# Patient Record
Sex: Female | Born: 2000 | Race: Black or African American | Hispanic: No | Marital: Single | Smoking: Never smoker
Health system: Southern US, Community
[De-identification: ages and names within clinical notes are randomized; demographics above are authoritative.]

## PROBLEM LIST (undated history)

## (undated) DIAGNOSIS — K8020 Calculus of gallbladder without cholecystitis without obstruction: Secondary | ICD-10-CM

---
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
## 2022-01-25 DIAGNOSIS — Z419 Encounter for procedure for purposes other than remedying health state, unspecified: Secondary | ICD-10-CM | POA: Diagnosis not present

## 2022-02-25 DIAGNOSIS — Z419 Encounter for procedure for purposes other than remedying health state, unspecified: Secondary | ICD-10-CM | POA: Diagnosis not present

## 2022-03-27 DIAGNOSIS — Z419 Encounter for procedure for purposes other than remedying health state, unspecified: Secondary | ICD-10-CM | POA: Diagnosis not present

## 2022-04-27 DIAGNOSIS — Z419 Encounter for procedure for purposes other than remedying health state, unspecified: Secondary | ICD-10-CM | POA: Diagnosis not present

## 2022-05-27 DIAGNOSIS — Z419 Encounter for procedure for purposes other than remedying health state, unspecified: Secondary | ICD-10-CM | POA: Diagnosis not present

## 2022-06-27 DIAGNOSIS — Z419 Encounter for procedure for purposes other than remedying health state, unspecified: Secondary | ICD-10-CM | POA: Diagnosis not present

## 2022-07-28 DIAGNOSIS — Z419 Encounter for procedure for purposes other than remedying health state, unspecified: Secondary | ICD-10-CM | POA: Diagnosis not present

## 2022-08-09 ENCOUNTER — Other Ambulatory Visit (HOSPITAL_COMMUNITY)
Admission: RE | Admit: 2022-08-09 | Discharge: 2022-08-09 | Disposition: A | Discharge status: Home / Self Care | Payer: Medicaid Other | Source: Ambulatory Visit | Attending: Family Medicine | Admitting: Family Medicine

## 2022-08-09 ENCOUNTER — Ambulatory Visit (INDEPENDENT_AMBULATORY_CARE_PROVIDER_SITE_OTHER): Payer: Medicaid Other

## 2022-08-09 ENCOUNTER — Other Ambulatory Visit: Payer: Self-pay

## 2022-08-09 DIAGNOSIS — Z3480 Encounter for supervision of other normal pregnancy, unspecified trimester: Secondary | ICD-10-CM

## 2022-08-09 DIAGNOSIS — O0990 Supervision of high risk pregnancy, unspecified, unspecified trimester: Secondary | ICD-10-CM | POA: Insufficient documentation

## 2022-08-09 MED ORDER — PRENATAL PLUS 27-1 MG PO TABS: 1.0000 | ORAL_TABLET | Freq: Every day | ORAL | 11 refills | Status: DC

## 2022-08-09 MED ORDER — BLOOD PRESSURE MONITORING DEVI: 1.0000 | 0 refills | Status: DC

## 2022-08-09 NOTE — Progress Notes (Signed)
New OB Intake  I connected with  Audrey Sanders on 08/09/22 at  1:15 PM EDT by In Person Visit and verified that I am speaking with the correct person using two identifiers. Nurse is located at [facility] and pt is located at [facility]  I discussed the limitations, risks, security and privacy concerns of performing an evaluation and management service by telephone and the availability of in person appointments. I also discussed with the patient that there may be a patient responsible charge related to this service. The patient expressed understanding and agreed to proceed.  I explained I am completing New OB Intake today. We discussed her EDD of 02/19/23 that is based on LMP of 05/15/22. Pt is G1/P0. I reviewed her allergies, medications, Medical/Surgical/OB history, and appropriate screenings. I informed her of [facility] services. [facility] information placed in AVS. Based on history, this is a/an  pregnancy uncomplicated .   There are no problems to display for this patient.   Concerns addressed today  Delivery Plans Plans to deliver at [facility]. Patient given information for [facility] Healthy [facility] website for more information about [facility]. Patient is not interested in water birth. Offered upcoming OB visit with CNM to discuss further.  MyChart/Babyscripts MyChart access verified. I explained pt will have some visits in office and some virtually. Babyscripts instructions given and order placed. Patient verifies receipt of registration text/e-mail. Account successfully created and app downloaded.  Blood Pressure Cuff/Weight Scale Blood pressure cuff ordered for patient to pick-up from [employer]. Explained after first prenatal appt pt will check weekly and document in Babyscripts. Patient does / does not  have weight scale. Weight scale ordered for patient to pick up from [employer].   Anatomy US Explained first scheduled [place] will be around 19 weeks. Anatomy US scheduled for  1030/23 at 0945a. Pt notified to arrive at 0930.  Labs Discussed Avelina Laine genetic screening with patient. Would like both Panorama and Horizon drawn at new OB visit. Routine prenatal labs needed.  Covid Vaccine Patient has covid vaccine.   Is patient a CenteringPregnancy candidate?  Declined Declined due to [facility] Not a candidate due to NA Centering Patient" indicated on sticky note   Is patient a Mom+Baby Combined Care candidate?  Accepted    Scheduled with Mom+Baby provider   Social Determinants of Health Food Insecurity: Patient denies food insecurity. WIC Referral: Patient is interested in referral to [facility].  Transportation: Patient denies transportation needs. Childcare: Discussed no children allowed at ultrasound appointments. Offered childcare services; patient declines childcare services at this time.  First visit review I reviewed new OB appt with pt. I explained she will have a provider visit that includes . Explained pt will be seen by Dr. Crissie Reese at first visit; encounter routed to appropriate provider. Explained that patient will be seen by pregnancy navigator following visit with provider.   Henrietta Dine, CMA 08/09/2022  1:30 PM

## 2022-08-09 NOTE — Patient Instructions (Signed)

## 2022-08-10 ENCOUNTER — Encounter: Payer: Self-pay | Admitting: Family Medicine

## 2022-08-10 DIAGNOSIS — Z2839 Other underimmunization status: Secondary | ICD-10-CM | POA: Insufficient documentation

## 2022-08-10 DIAGNOSIS — O09899 Supervision of other high risk pregnancies, unspecified trimester: Secondary | ICD-10-CM | POA: Insufficient documentation

## 2022-08-10 LAB — CBC/D/PLT+RPR+RH+ABO+RUBIGG...
Antibody Screen: NEGATIVE
Basophils Absolute: 0 10*3/uL (ref 0.0–0.2)
Basos: 0 %
EOS (ABSOLUTE): 0.1 10*3/uL (ref 0.0–0.4)
Eos: 1 %
HCV Ab: NONREACTIVE
HIV Screen 4th Generation wRfx: NONREACTIVE
Hematocrit: 34.4 % (ref 34.0–46.6)
Hemoglobin: 12.2 g/dL (ref 11.1–15.9)
Hepatitis B Surface Ag: NEGATIVE
Immature Grans (Abs): 0 10*3/uL (ref 0.0–0.1)
Immature Granulocytes: 0 %
Lymphocytes Absolute: 1.7 10*3/uL (ref 0.7–3.1)
Lymphs: 26 %
MCH: 31.3 pg (ref 26.6–33.0)
MCHC: 35.5 g/dL (ref 31.5–35.7)
MCV: 88 fL (ref 79–97)
Monocytes Absolute: 0.5 10*3/uL (ref 0.1–0.9)
Monocytes: 7 %
Neutrophils Absolute: 4.3 10*3/uL (ref 1.4–7.0)
Neutrophils: 66 %
Platelets: 234 10*3/uL (ref 150–450)
RBC: 3.9 x10E6/uL (ref 3.77–5.28)
RDW: 11.8 % (ref 11.7–15.4)
RPR Ser Ql: NONREACTIVE
Rh Factor: POSITIVE
Rubella Antibodies, IGG: <0.9 index — ABNORMAL LOW (ref >0.99)
WBC: 6.6 10*3/uL (ref 3.4–10.8)

## 2022-08-10 LAB — HCV INTERPRETATION

## 2022-08-10 LAB — GC/CHLAMYDIA PROBE AMP (~~LOC~~) NOT AT ARMC
Chlamydia: NEGATIVE
Comment: NEGATIVE
Comment: NORMAL
Neisseria Gonorrhea: NEGATIVE

## 2022-08-10 LAB — HEMOGLOBIN A1C
Est. average glucose Bld gHb Est-mCnc: 114 mg/dL
Hgb A1c MFr Bld: 5.6 % (ref 4.8–5.6)

## 2022-08-11 ENCOUNTER — Encounter: Payer: Self-pay | Admitting: Family Medicine

## 2022-08-11 ENCOUNTER — Other Ambulatory Visit: Payer: Self-pay

## 2022-08-11 ENCOUNTER — Ambulatory Visit (INDEPENDENT_AMBULATORY_CARE_PROVIDER_SITE_OTHER): Payer: Medicaid Other | Admitting: Family Medicine

## 2022-08-11 VITALS — BP 119/78 | HR 84 | Wt 99.8 lb

## 2022-08-11 DIAGNOSIS — Z2839 Other underimmunization status: Secondary | ICD-10-CM

## 2022-08-11 DIAGNOSIS — O09899 Supervision of other high risk pregnancies, unspecified trimester: Secondary | ICD-10-CM

## 2022-08-11 DIAGNOSIS — Z3480 Encounter for supervision of other normal pregnancy, unspecified trimester: Secondary | ICD-10-CM

## 2022-08-11 LAB — CULTURE, OB URINE

## 2022-08-11 LAB — URINE CULTURE, OB REFLEX

## 2022-08-11 NOTE — Patient Instructions (Signed)

## 2022-08-11 NOTE — Progress Notes (Signed)
      Subjective:   Audrey Sanders is a 21 y.o. G1P0000 at [redacted]w[redacted]d by LMP being seen today for her first obstetrical visit.  Her obstetrical history is significant for  teen pregnancy . Patient does intend to breast feed. Pregnancy history fully reviewed.  Patient reports no complaints.  HISTORY: OB History  Gravida Para Term Preterm AB Living  1 0 0 0 0 0  SAB IAB Ectopic Multiple Live Births  0 0 0 0 0    # Outcome Date GA Lbr Len/2nd Weight Sex Delivery Anes PTL Lv  1 Current              Last pap smear: No results found for: "DIAGPAP", "HPV", "Satilla" N/a  History reviewed. No pertinent past medical history. History reviewed. No pertinent surgical history. History reviewed. No pertinent family history. Social History   Tobacco Use   Smoking status: Never    Passive exposure: Never   Smokeless tobacco: Never  Vaping Use   Vaping Use: Never used  Substance Use Topics   Alcohol use: Never   Drug use: Never   Not on File Current Outpatient Medications on File Prior to Visit  Medication Sig Dispense Refill   Blood Pressure Monitoring DEVI 1 each by Does not apply route once a week. 1 each 0   prenatal vitamin w/FE, FA (PRENATAL 1 + 1) 27-1 MG TABS tablet Take 1 tablet by mouth daily at 12 noon. 30 tablet 11   No current facility-administered medications on file prior to visit.     Exam   Vitals:   08/11/22 0916  BP: 119/78  Pulse: 84  Weight: 99 lb 12.8 oz (45.3 kg)   Fetal Heart Rate (bpm): 169  System: General: well-developed, well-nourished female in no acute distress   Skin: normal coloration and turgor, no rashes   Neurologic: oriented, normal, negative, normal mood   Extremities: normal strength, tone, and muscle mass, ROM of all joints is normal   HEENT PERRLA, extraocular movement intact and sclera clear, anicteric   Neck supple and no masses   Respiratory:  no respiratory distress      Assessment:   Pregnancy: G1P0000 Patient Active  Problem List   Diagnosis Date Noted   Rubella non-immune status, antepartum 08/10/2022   Supervision of other normal pregnancy, antepartum 08/09/2022     Plan:  1. Supervision of other normal pregnancy, antepartum Initial labs reviewed, notable only for rubella non immune Continue prenatal vitamins. Genetic Screening discussed, NIPS: result pending. Ultrasound discussed; fetal anatomic survey: ordered. Problem list reviewed and updated. The nature of Dyad/Family Care clinic was explained to patient; Voiced they may need to be seen by other [facility] providers which includes family medicine physicians, OB GYNs, and APPs. Delivery will hopefully be with one of the Dyad providers or another [facility] Medicine physician and we cannot promise this at this time.  Discussed there are [facility] staff in the hospital 24-7 and they understand and support this model and there is a likelihood one of these providers will catch their baby.  We also discussed that the service includes learners (residents, student) and they will be involved in the care team.   2. Rubella non-immune status, antepartum Offer MMR PP   Routine obstetric precautions reviewed. Return in 4 weeks (on 09/08/2022) for Dyad patient, ob visit.

## 2022-08-15 LAB — PANORAMA PRENATAL TEST FULL PANEL:PANORAMA TEST PLUS 5 ADDITIONAL MICRODELETIONS: FETAL FRACTION: 8.1

## 2022-08-21 LAB — HORIZON CUSTOM: REPORT SUMMARY: NEGATIVE

## 2022-08-24 ENCOUNTER — Encounter: Payer: Self-pay | Admitting: Family Medicine

## 2022-08-27 DIAGNOSIS — Z419 Encounter for procedure for purposes other than remedying health state, unspecified: Secondary | ICD-10-CM | POA: Diagnosis not present

## 2022-09-12 ENCOUNTER — Encounter: Payer: Self-pay | Admitting: *Deleted

## 2022-09-12 DIAGNOSIS — Z3480 Encounter for supervision of other normal pregnancy, unspecified trimester: Secondary | ICD-10-CM

## 2022-09-14 ENCOUNTER — Other Ambulatory Visit: Payer: Self-pay

## 2022-09-14 ENCOUNTER — Ambulatory Visit (INDEPENDENT_AMBULATORY_CARE_PROVIDER_SITE_OTHER): Payer: Medicaid Other | Admitting: Family Medicine

## 2022-09-14 VITALS — BP 113/73 | HR 96 | Wt 102.6 lb

## 2022-09-14 DIAGNOSIS — O09899 Supervision of other high risk pregnancies, unspecified trimester: Secondary | ICD-10-CM

## 2022-09-14 DIAGNOSIS — Z2839 Other underimmunization status: Secondary | ICD-10-CM

## 2022-09-14 DIAGNOSIS — Z3480 Encounter for supervision of other normal pregnancy, unspecified trimester: Secondary | ICD-10-CM | POA: Diagnosis not present

## 2022-09-14 NOTE — Progress Notes (Signed)
   PRENATAL VISIT NOTE  Subjective:  Audrey Sanders is a 21 y.o. G1P0000 at [redacted]w[redacted]d being seen today for ongoing prenatal care.  She is currently monitored for the following issues for this low-risk pregnancy and has Supervision of other normal pregnancy, antepartum and Rubella non-immune status, antepartum on their problem list.  Patient reports no complaints.  Contractions: Not present. Vag. Bleeding: None.  Movement: Present. Denies leaking of fluid.   The following portions of the patient's history were reviewed and updated as appropriate: allergies, current medications, past family history, past medical history, past social history, past surgical history and problem list.   Objective:   Vitals:   09/14/22 1111  BP: 113/73  Pulse: 96  Weight: 102 lb 9.6 oz (46.5 kg)    Fetal Status: Fetal Heart Rate (bpm): 156 Fundal Height: 17 cm Movement: Present     General:  Alert, oriented and cooperative. Patient is in no acute distress.  Skin: Skin is warm and dry. No rash noted.   Cardiovascular: Normal heart rate noted  Respiratory: Normal respiratory effort, no problems with respiration noted  Abdomen: Soft, gravid, appropriate for gestational age.  Pain/Pressure: Absent     Pelvic: Cervical exam deferred        Extremities: Normal range of motion.     Mental Status: Normal mood and affect. Normal behavior. Normal judgment and thought content.   Assessment and Plan:  Pregnancy: G1P0000 at [redacted]w[redacted]d 1. Supervision of other normal pregnancy, antepartum TWG=2 lb 9.6 oz (1.179 kg)  - AFP, Serum, Open Spina Bifida  2. Rubella non-immune status, antepartum MMR PP  Preterm labor symptoms and general obstetric precautions including but not limited to vaginal bleeding, contractions, leaking of fluid and fetal movement were reviewed in detail with the patient. Please refer to After Visit Summary for other counseling recommendations.   Return in about 4 weeks (around 10/12/2022) for Mom+Baby  Combined Care.  Future Appointments  Date Time Provider [place]  09/25/2022  9:45 AM [facility] US5 [facility]  10/13/2022 11:15 AM Clarnce Flock, MD [facility]  11/13/2022 10:15 AM Caren Macadam, MD [facility]    Caren Macadam, MD

## 2022-09-16 LAB — AFP, SERUM, OPEN SPINA BIFIDA
AFP MoM: 0.77
AFP Value: 44.6 ng/mL
Gest. Age on Collection Date: 17.3 weeks
Maternal Age At EDD: 19.2 yr
OSBR Risk 1 IN: 10000
Test Results:: NEGATIVE
Weight: 102 [lb_av]

## 2022-09-25 ENCOUNTER — Ambulatory Visit: Payer: Medicaid Other | Attending: Family Medicine

## 2022-09-27 DIAGNOSIS — Z419 Encounter for procedure for purposes other than remedying health state, unspecified: Secondary | ICD-10-CM | POA: Diagnosis not present

## 2022-10-13 ENCOUNTER — Encounter: Payer: Self-pay | Admitting: Family Medicine

## 2022-10-13 ENCOUNTER — Ambulatory Visit (INDEPENDENT_AMBULATORY_CARE_PROVIDER_SITE_OTHER): Payer: Medicaid Other | Admitting: Family Medicine

## 2022-10-13 VITALS — BP 112/70 | HR 87 | Ht 61.0 in | Wt 106.3 lb

## 2022-10-13 DIAGNOSIS — O09899 Supervision of other high risk pregnancies, unspecified trimester: Secondary | ICD-10-CM

## 2022-10-13 DIAGNOSIS — Z2839 Other underimmunization status: Secondary | ICD-10-CM

## 2022-10-13 DIAGNOSIS — Z3480 Encounter for supervision of other normal pregnancy, unspecified trimester: Secondary | ICD-10-CM

## 2022-10-13 NOTE — Progress Notes (Signed)
   Subjective:  Audrey Sanders is a 21 y.o. G1P0000 at 75w4dbeing seen today for ongoing prenatal care.  She is currently monitored for the following issues for this low-risk pregnancy and has Supervision of other normal pregnancy, antepartum and Rubella non-immune status, antepartum on their problem list.  Patient reports no complaints.  Contractions: Not present. Vag. Bleeding: None.  Movement: Present. Denies leaking of fluid.   The following portions of the patient's history were reviewed and updated as appropriate: allergies, current medications, past family history, past medical history, past social history, past surgical history and problem list. Problem list updated.  Objective:   Vitals:   10/13/22 1121 10/13/22 1123  BP: 112/70   Pulse: 87   Weight: 106 lb 4.8 oz (48.2 kg)   Height:  _0  (1.549 m)    Fetal Status: Fetal Heart Rate (bpm): 153   Movement: Present     General:  Alert, oriented and cooperative. Patient is in no acute distress.  Skin: Skin is warm and dry. No rash noted.   Cardiovascular: Normal heart rate noted  Respiratory: Normal respiratory effort, no problems with respiration noted  Abdomen: Soft, gravid, appropriate for gestational age. Pain/Pressure: Absent     Pelvic: Vag. Bleeding: None     Cervical exam deferred        Extremities: Normal range of motion.     Mental Status: Normal mood and affect. Normal behavior. Normal judgment and thought content.   Urinalysis:      Assessment and Plan:  Pregnancy: G1P0000 at 246w4d1. Supervision of other normal pregnancy, antepartum BP and FHR normal Missed appt for anatomy scan, she will go reschedule w MFM after our visit  2. Rubella non-immune status, antepartum Offer MMR PP  Preterm labor symptoms and general obstetric precautions including but not limited to vaginal bleeding, contractions, leaking of fluid and fetal movement were reviewed in detail with the patient. Please refer to After Visit  Summary for other counseling recommendations.  Return in 4 weeks (on 11/10/2022) for Dyad patient, ob visit.   EcClarnce FlockMD

## 2022-10-13 NOTE — Patient Instructions (Addendum)

## 2022-10-24 ENCOUNTER — Ambulatory Visit: Payer: Medicaid Other | Attending: Family Medicine

## 2022-10-24 DIAGNOSIS — Z3480 Encounter for supervision of other normal pregnancy, unspecified trimester: Secondary | ICD-10-CM | POA: Insufficient documentation

## 2022-10-24 DIAGNOSIS — Z363 Encounter for antenatal screening for malformations: Secondary | ICD-10-CM | POA: Diagnosis not present

## 2022-10-24 DIAGNOSIS — Z3482 Encounter for supervision of other normal pregnancy, second trimester: Secondary | ICD-10-CM | POA: Insufficient documentation

## 2022-10-24 DIAGNOSIS — Z3A23 23 weeks gestation of pregnancy: Secondary | ICD-10-CM | POA: Diagnosis not present

## 2022-10-27 DIAGNOSIS — Z419 Encounter for procedure for purposes other than remedying health state, unspecified: Secondary | ICD-10-CM | POA: Diagnosis not present

## 2022-11-13 ENCOUNTER — Encounter: Payer: Self-pay | Admitting: Family Medicine

## 2022-11-13 ENCOUNTER — Ambulatory Visit (INDEPENDENT_AMBULATORY_CARE_PROVIDER_SITE_OTHER): Payer: Medicaid Other | Admitting: Family Medicine

## 2022-11-13 ENCOUNTER — Other Ambulatory Visit: Payer: Self-pay

## 2022-11-13 VITALS — BP 108/56 | HR 109 | Wt 115.0 lb

## 2022-11-13 DIAGNOSIS — Z3480 Encounter for supervision of other normal pregnancy, unspecified trimester: Secondary | ICD-10-CM

## 2022-11-13 DIAGNOSIS — Z3A26 26 weeks gestation of pregnancy: Secondary | ICD-10-CM

## 2022-11-13 DIAGNOSIS — Z3482 Encounter for supervision of other normal pregnancy, second trimester: Secondary | ICD-10-CM

## 2022-11-13 DIAGNOSIS — Q210 Ventricular septal defect: Secondary | ICD-10-CM

## 2022-11-13 DIAGNOSIS — Z2839 Other underimmunization status: Secondary | ICD-10-CM

## 2022-11-13 DIAGNOSIS — O09892 Supervision of other high risk pregnancies, second trimester: Secondary | ICD-10-CM

## 2022-11-13 DIAGNOSIS — O09899 Supervision of other high risk pregnancies, unspecified trimester: Secondary | ICD-10-CM

## 2022-11-13 NOTE — Progress Notes (Signed)
   PRENATAL VISIT NOTE  Subjective:  Audrey Sanders is a 21 y.o. G1P0000 at [redacted]w[redacted]d being seen today for ongoing prenatal care.  She is currently monitored for the following issues for this low-risk pregnancy and has Supervision of other normal pregnancy, antepartum; Rubella non-immune status, antepartum; and VSD (ventricular septal defect), muscular on their problem list.  Patient reports no complaints.  Contractions: Not present. Vag. Bleeding: None.  Movement: Present. Denies leaking of fluid.   The following portions of the patient's history were reviewed and updated as appropriate: allergies, current medications, past family history, past medical history, past social history, past surgical history and problem list.   Objective:   Vitals:   11/13/22 1030  BP: (!) 108/56  Pulse: (!) 109  Weight: 115 lb (52.2 kg)    Fetal Status: Fetal Heart Rate (bpm): 144 Fundal Height: 27 cm Movement: Present     General:  Alert, oriented and cooperative. Patient is in no acute distress.  Skin: Skin is warm and dry. No rash noted.   Cardiovascular: Normal heart rate noted  Respiratory: Normal respiratory effort, no problems with respiration noted  Abdomen: Soft, gravid, appropriate for gestational age.  Pain/Pressure: Absent     Pelvic: Cervical exam deferred        Extremities: Normal range of motion.     Mental Status: Normal mood and affect. Normal behavior. Normal judgment and thought content.   Assessment and Plan:  Pregnancy: G1P0000 at [redacted]w[redacted]d 1. Supervision of other normal pregnancy, antepartum FH appropriate No concerns today Up to date otherwise Knows about lab visit for GTT and appt with Dr. Crissie Reese  Preterm labor symptoms and general obstetric precautions including but not limited to vaginal bleeding, contractions, leaking of fluid and fetal movement were reviewed in detail with the patient. Please refer to After Visit Summary for other counseling recommendations.   Return in  about 17 days (around 11/30/2022) for Mom+Baby Combined Care.  Future Appointments  Date Time Provider Department Center  11/30/2022  8:15 AM Venora Maples, MD [facility]  11/30/2022  8:20 AM [facility] LAB [facility]  12/13/2022  1:15 PM Federico Flake, MD [facility]  12/26/2022  9:55 AM Venora Maples, MD [facility]    Federico Flake, MD

## 2022-11-27 DIAGNOSIS — Z419 Encounter for procedure for purposes other than remedying health state, unspecified: Secondary | ICD-10-CM | POA: Diagnosis not present

## 2022-11-27 NOTE — L&D Delivery Note (Signed)
Delivery Note Audrey Sanders is a 22 y.o. G1P0000 at [redacted]w[redacted]d admitted for active labor.   GBS Status:  Negative/-- (02/29 [id number])  Labor course: Initial SVE: 6/100/-1. Augmentation with: AROM. She then progressed to complete.  ROM: 2h 48m with clear fluid  Birth: Delivery of a Live born adult  Birth Weight:   APGAR: ,   Newborn Delivery   Birth date/time: [birth year] 0127 Delivery type: SVD         Delivered via spontaneous vaginal delivery (Presentation: OA ). Nuchal cord present: No.  Shoulders and body delivered in usual fashion. Infant placed directly on mom's abdomen for bonding/skin-to-skin, baby dried and stimulated. Cord clamped x 2 after 1 minute and cut by grandmother.  Cord blood collected. Placenta delivered-Spontaneous  with 3 vessels . 20u Pitocin in 500cc LR given as a bolus prior to delivery of placenta.  Fundus firm with massage. Placenta inspected and appears to be intact with a 3 VC.  Sponge and instrument count were correct x2.  Intrapartum complications:  None Anesthesia:  none Lacerations:  none Suture Repair:  EBL (mL):228.00     Mom to postpartum.  Baby to Couplet care / Skin to Skin. Placenta to L&D   Plans to Bottlefeed Contraception:  unsure Circumcision: wants inpatient  Note sent to [facility]: [facility] for pp visit.  Delivery Report:   Review the Delivery Report for details.    Delivery by Dr. Arlyce Dice under my guidance and direct supervision   Signed: Christin Fudge, DNP,CNM 02/13/2023, 1:58 AM

## 2022-11-30 ENCOUNTER — Other Ambulatory Visit: Payer: Self-pay

## 2022-11-30 ENCOUNTER — Ambulatory Visit (INDEPENDENT_AMBULATORY_CARE_PROVIDER_SITE_OTHER): Payer: Medicaid Other | Admitting: Family Medicine

## 2022-11-30 ENCOUNTER — Other Ambulatory Visit: Payer: Medicaid Other

## 2022-11-30 ENCOUNTER — Encounter: Payer: Self-pay | Admitting: Family Medicine

## 2022-11-30 VITALS — BP 114/79 | HR 81 | Wt 115.7 lb

## 2022-11-30 DIAGNOSIS — O09893 Supervision of other high risk pregnancies, third trimester: Secondary | ICD-10-CM

## 2022-11-30 DIAGNOSIS — Q210 Ventricular septal defect: Secondary | ICD-10-CM

## 2022-11-30 DIAGNOSIS — O09899 Supervision of other high risk pregnancies, unspecified trimester: Secondary | ICD-10-CM

## 2022-11-30 DIAGNOSIS — Z3A28 28 weeks gestation of pregnancy: Secondary | ICD-10-CM

## 2022-11-30 DIAGNOSIS — Z23 Encounter for immunization: Secondary | ICD-10-CM | POA: Diagnosis not present

## 2022-11-30 DIAGNOSIS — Z3480 Encounter for supervision of other normal pregnancy, unspecified trimester: Secondary | ICD-10-CM

## 2022-11-30 DIAGNOSIS — Z2839 Other underimmunization status: Secondary | ICD-10-CM

## 2022-11-30 MED ORDER — ONDANSETRON 4 MG PO TBDP: 4.0000 mg | ORAL_TABLET | Freq: Four times a day (QID) | ORAL | 0 refills | Status: DC | PRN

## 2022-11-30 NOTE — Progress Notes (Signed)
   Subjective:  Audrey Sanders is a 22 y.o. G1P0000 at 36w3dbeing seen today for ongoing prenatal care.  She is currently monitored for the following issues for this low-risk pregnancy and has Supervision of other normal pregnancy, antepartum; Rubella non-immune status, antepartum; and VSD (ventricular septal defect), muscular on their problem list.  Patient reports no complaints.  Contractions: Not present. Vag. Bleeding: None.  Movement: Present. Denies leaking of fluid.   The following portions of the patient's history were reviewed and updated as appropriate: allergies, current medications, past family history, past medical history, past social history, past surgical history and problem list. Problem list updated.  Objective:   Vitals:   11/30/22 0839 11/30/22 0841  BP:  114/79  Pulse:  81  Weight: 115 lb 11.2 oz (52.5 kg) 115 lb 11.2 oz (52.5 kg)    Fetal Status: Fetal Heart Rate (bpm): 139   Movement: Present     General:  Alert, oriented and cooperative. Patient is in no acute distress.  Skin: Skin is warm and dry. No rash noted.   Cardiovascular: Normal heart rate noted  Respiratory: Normal respiratory effort, no problems with respiration noted  Abdomen: Soft, gravid, appropriate for gestational age. Pain/Pressure: Absent     Pelvic: Vag. Bleeding: None     Cervical exam deferred        Extremities: Normal range of motion.     Mental Status: Normal mood and affect. Normal behavior. Normal judgment and thought content.   Urinalysis:      Assessment and Plan:  Pregnancy: G1P0000 at 260w3d1. Supervision of other normal pregnancy, antepartum BP and FHR normal Unable to tolerate glucola, rescheduled and rx sent for zofran, instructed to take a half hour before next glucola  2. Rubella non-immune status, antepartum Offer MMR PP  3. VSD (ventricular septal defect), [place] reviewed, two small muscular VSD's which were  likely to resolve with growth Referral to Cardio OB, TTE ordered Given congenital heart disease also needs fetal echo, this order was placed as well  Preterm labor symptoms and general obstetric precautions including but not limited to vaginal bleeding, contractions, leaking of fluid and fetal movement were reviewed in detail with the patient. Please refer to After Visit Summary for other counseling recommendations.  Return in 2 weeks (on 12/14/2022) for Dyad patient, ob visit.   EcClarnce FlockMD

## 2022-11-30 NOTE — Patient Instructions (Signed)

## 2022-12-06 ENCOUNTER — Other Ambulatory Visit: Payer: Self-pay

## 2022-12-06 ENCOUNTER — Other Ambulatory Visit: Payer: Medicaid Other

## 2022-12-06 DIAGNOSIS — Z3480 Encounter for supervision of other normal pregnancy, unspecified trimester: Secondary | ICD-10-CM

## 2022-12-07 LAB — CBC
Hematocrit: 31.4 % — ABNORMAL LOW (ref 34.0–46.6)
Hemoglobin: 11 g/dL — ABNORMAL LOW (ref 11.1–15.9)
MCH: 31.9 pg (ref 26.6–33.0)
MCHC: 35 g/dL (ref 31.5–35.7)
MCV: 91 fL (ref 79–97)
Platelets: 289 10*3/uL (ref 150–450)
RBC: 3.45 x10E6/uL — ABNORMAL LOW (ref 3.77–5.28)
RDW: 11.5 % — ABNORMAL LOW (ref 11.7–15.4)
WBC: 6.2 10*3/uL (ref 3.4–10.8)

## 2022-12-07 LAB — GLUCOSE TOLERANCE, 2 HOURS W/ 1HR
Glucose, 1 hour: 212 mg/dL — ABNORMAL HIGH (ref 70–179)
Glucose, 2 hour: 168 mg/dL — ABNORMAL HIGH (ref 70–152)
Glucose, Fasting: 88 mg/dL (ref 70–91)

## 2022-12-07 LAB — RPR: RPR Ser Ql: NONREACTIVE

## 2022-12-07 LAB — HIV ANTIBODY (ROUTINE TESTING W REFLEX): HIV Screen 4th Generation wRfx: NONREACTIVE

## 2022-12-08 ENCOUNTER — Encounter: Payer: Self-pay | Admitting: Family Medicine

## 2022-12-08 DIAGNOSIS — O24419 Gestational diabetes mellitus in pregnancy, unspecified control: Secondary | ICD-10-CM | POA: Insufficient documentation

## 2022-12-12 ENCOUNTER — Encounter: Payer: Self-pay | Admitting: General Practice

## 2022-12-12 ENCOUNTER — Other Ambulatory Visit: Payer: Self-pay | Admitting: General Practice

## 2022-12-12 DIAGNOSIS — O24419 Gestational diabetes mellitus in pregnancy, unspecified control: Secondary | ICD-10-CM

## 2022-12-12 MED ORDER — ACCU-CHEK GUIDE VI STRP: ORAL_STRIP | 12 refills | Status: DC

## 2022-12-12 MED ORDER — ACCU-CHEK SOFTCLIX LANCETS MISC: 12 refills | Status: DC

## 2022-12-12 MED ORDER — ACCU-CHEK GUIDE W/DEVICE KIT: 1.0000 | PACK | Freq: Once | 0 refills | Status: AC

## 2022-12-13 ENCOUNTER — Encounter: Payer: Self-pay | Admitting: Family Medicine

## 2022-12-13 ENCOUNTER — Ambulatory Visit (INDEPENDENT_AMBULATORY_CARE_PROVIDER_SITE_OTHER): Payer: Medicaid Other | Admitting: Family Medicine

## 2022-12-13 ENCOUNTER — Other Ambulatory Visit: Payer: Self-pay

## 2022-12-13 VITALS — BP 100/60 | HR 87 | Wt 116.0 lb

## 2022-12-13 DIAGNOSIS — O0990 Supervision of high risk pregnancy, unspecified, unspecified trimester: Secondary | ICD-10-CM

## 2022-12-13 DIAGNOSIS — Z3A30 30 weeks gestation of pregnancy: Secondary | ICD-10-CM

## 2022-12-13 DIAGNOSIS — Q210 Ventricular septal defect: Secondary | ICD-10-CM

## 2022-12-13 DIAGNOSIS — O0993 Supervision of high risk pregnancy, unspecified, third trimester: Secondary | ICD-10-CM

## 2022-12-13 DIAGNOSIS — O24419 Gestational diabetes mellitus in pregnancy, unspecified control: Secondary | ICD-10-CM

## 2022-12-13 LAB — GLUCOSE, CAPILLARY: Glucose-Capillary: 70 mg/dL (ref 70–99)

## 2022-12-13 NOTE — Progress Notes (Signed)
   PRENATAL VISIT NOTE  Subjective:  Audrey Sanders is a 22 y.o. G1P0000 at [redacted]w[redacted]d being seen today for ongoing prenatal care.  She is currently monitored for the following issues for this high-risk pregnancy and has Supervision of high risk pregnancy, antepartum; Rubella non-immune status, antepartum; VSD (ventricular septal defect), muscular; and Gestational diabetes mellitus (GDM) affecting pregnancy, antepartum on their problem list.  Patient reports no complaints.  Contractions: Not present. Vag. Bleeding: None.  Movement: Present. Denies leaking of fluid.   The following portions of the patient's history were reviewed and updated as appropriate: allergies, current medications, past family history, past medical history, past social history, past surgical history and problem list.   Objective:   Vitals:   12/13/22 1347  BP: 100/60  Pulse: 87  Weight: 116 lb (52.6 kg)    Fetal Status: Fetal Heart Rate (bpm): 148   Movement: Present     General:  Alert, oriented and cooperative. Patient is in no acute distress.  Skin: Skin is warm and dry. No rash noted.   Cardiovascular: Normal heart rate noted  Respiratory: Normal respiratory effort, no problems with respiration noted  Abdomen: Soft, gravid, appropriate for gestational age.  Pain/Pressure: Present     Pelvic: Cervical exam deferred        Extremities: Normal range of motion.     Mental Status: Normal mood and affect. Normal behavior. Normal judgment and thought content.   Assessment and Plan:  Pregnancy: G1P0000 at [redacted]w[redacted]d  1. Gestational diabetes mellitus (GDM) affecting pregnancy, antepartum Fasting today 70 DM education and glucometer orientation provided by Colletta Maryland today!! Has Nutrition appt upcoming Reviewed need for growth [place] Discussed eating every 2-3 hours and eating for diabetes - [place] MFM OB FOLLOW UP; Future  2. Supervision of high risk pregnancy, antepartum Up to date TWG=16 lb (7.258 kg) which is at  goal Vigorous movement of baby Nervous about GDM but feeling better with teaching  3. VSD (ventricular septal defect), muscular Coordinated getting CVD appt scheduled   Preterm labor symptoms and general obstetric precautions including but not limited to vaginal bleeding, contractions, leaking of fluid and fetal movement were reviewed in detail with the patient. Please refer to After Visit Summary for other counseling recommendations.   Return in about 2 weeks (around 12/27/2022) for Mom+Baby Combined Care.  Future Appointments  Date Time Provider [place]  12/21/2022  1:15 PM [facility]  12/27/2022  2:55 PM Caren Macadam, MD [facility]  01/10/2023  3:30 PM [facility] US3 [facility]    Caren Macadam, MD

## 2022-12-19 ENCOUNTER — Other Ambulatory Visit: Payer: Medicaid Other

## 2022-12-21 ENCOUNTER — Encounter: Payer: Medicaid Other | Attending: Family Medicine | Admitting: Registered"

## 2022-12-21 ENCOUNTER — Ambulatory Visit (INDEPENDENT_AMBULATORY_CARE_PROVIDER_SITE_OTHER): Payer: Medicaid Other | Admitting: Registered"

## 2022-12-21 ENCOUNTER — Other Ambulatory Visit: Payer: Self-pay

## 2022-12-21 DIAGNOSIS — O24419 Gestational diabetes mellitus in pregnancy, unspecified control: Secondary | ICD-10-CM | POA: Insufficient documentation

## 2022-12-21 DIAGNOSIS — Z713 Dietary counseling and surveillance: Secondary | ICD-10-CM | POA: Insufficient documentation

## 2022-12-21 DIAGNOSIS — Z3A31 31 weeks gestation of pregnancy: Secondary | ICD-10-CM

## 2022-12-21 NOTE — Progress Notes (Signed)
Patient was seen for Gestational Diabetes self-management on 12/21/22  Start time 1324 and End time 1418  Estimated due date: 02/19/23; [redacted]w[redacted]d  Clinical: Medications: reviewed Medical History: reviewed Labs: OGTT 88-212H-168H, A1c 5.6%     Dietary and Lifestyle History: Pt states she is not taking her prenatal vitamin because they make her feel sick. Pt is likely not getting the nutrition she needs for healthy pregnancy.  Pt states does not like milk, only occasionally has almond milk. Eats a limited amount of yogurt and cheese. Pt mostly relies on processed snacks for dietary intake.  Diet: Pt reports sometimes doesn't eat more than 1 meal in a day, instead she might snack on things like chips, cookies, honey buns. Pt states she also likes a variety of vegetables. Pt states sometimes has a snack in the middle of the night, some BG readings may not be true fasting.  Physical Activity: walking 30 min 2x/week Stress: not assessed Sleep: nap from 8p-11p and then sleeps again 3 am - 12 pm  24 hr Recall:  First Meal: none Snack: Second meal: Snack: Third meal: 2 c rice, chicken, 1/2 c corn (144 mg/dL) Snack: Beverages: water, juice 3x/week, soda 2-3 x/week  NUTRITION INTERVENTION  Nutrition education (E-1) on the following topics:   Initial Follow-up  [x]  []  Definition of Gestational Diabetes [x]  []  Why dietary management is important in controlling blood glucose [x]  []  Effects each nutrient has on blood glucose levels []  []  Simple carbohydrates vs complex carbohydrates [x]  []  Fluid intake [x]  []  Creating a balanced meal plan [x]  []  Carbohydrate counting  [x]  []  When to check blood glucose levels [x]  []  Proper blood glucose monitoring techniques [x]  []  Effect of stress and stress reduction techniques  [x]  []  Exercise effect on blood glucose levels, appropriate exercise during pregnancy [x]  []  Importance of limiting caffeine and abstaining from alcohol and  smoking [x]  []  Medications used for blood sugar control during pregnancy [x]  []  Hypoglycemia and rule of 15 [x]  []  Postpartum self care  Patient instructed to monitor glucose levels: FBS: 60 - ? 95 mg/dL; 2 hour: ? 120 mg/dL  Patient instructed to increase intake foods with higher calcium. Pt states she is willing to try including more milk in diet in form of making smoothies/shakes at home.  Patient received handouts: Nutrition Diabetes and Pregnancy Carbohydrate Counting List  Patient will be seen for follow-up as needed.

## 2022-12-22 ENCOUNTER — Encounter: Payer: Self-pay | Admitting: Cardiology

## 2022-12-22 ENCOUNTER — Ambulatory Visit (INDEPENDENT_AMBULATORY_CARE_PROVIDER_SITE_OTHER): Payer: Medicaid Other | Admitting: Cardiology

## 2022-12-22 VITALS — BP 98/70 | HR 87 | Ht 60.5 in | Wt 117.9 lb

## 2022-12-22 DIAGNOSIS — Z3A31 31 weeks gestation of pregnancy: Secondary | ICD-10-CM

## 2022-12-22 DIAGNOSIS — Q210 Ventricular septal defect: Secondary | ICD-10-CM | POA: Diagnosis not present

## 2022-12-22 DIAGNOSIS — O0990 Supervision of high risk pregnancy, unspecified, unspecified trimester: Secondary | ICD-10-CM | POA: Diagnosis not present

## 2022-12-22 NOTE — Patient Instructions (Signed)
Medication Instructions:  Your physician recommends that you continue on your current medications as directed. Please refer to the Current Medication list given to you today.  *If you need a refill on your cardiac medications before your next appointment, please call your pharmacy*   Lab Work: None   Testing/Procedures: Your physician has requested that you have an echocardiogram. Echocardiography is a painless test that uses sound waves to create images of your heart. It provides your doctor with information about the size and shape of your heart and how well your heart's chambers and valves are working. This procedure takes approximately one hour. There are no restrictions for this procedure. Please do NOT wear cologne, perfume, aftershave, or lotions (deodorant is allowed). Please arrive 15 minutes prior to your appointment time.    Follow-Up: At [facility], you and your health needs are our priority.  As part of our continuing mission to provide you with exceptional heart care, we have created designated Provider Care Teams.  These Care Teams include your primary Cardiologist (physician) and Advanced Practice Providers (APPs -  Physician Assistants and Nurse Practitioners) who all work together to provide you with the care you need, when you need it.  We recommend signing up for the patient portal called "MyChart".  Sign up information is provided on this After Visit Summary.  MyChart is used to connect with patients for Virtual Visits (Telemedicine).  Patients are able to view lab/test results, encounter notes, upcoming appointments, etc.  Non-urgent messages can be sent to your provider as well.   To learn more about what you can do with MyChart, go to [email or web address].    Your next appointment:   12 week(s)  Provider:   Berniece Salines  [facility] [address] [place], [place] [address]

## 2022-12-22 NOTE — Progress Notes (Unsigned)
Cardio-Obstetrics Clinic  New Evaluation  Date:  12/23/2022   ID:  Audrey Sanders, DOB May 01, 2001, MRN [id number]  PCP:  Pcp, No   [facility] Providers Cardiologist:  None  Electrophysiologist:  None       Referring MD: Venora Maples, MD   Chief Complaint: " I was asked to see you because I have VSD"  History of Present Illness:    Audrey Sanders is a 22 y.o. female [G1P0000] who is being seen today for the evaluation of Muscular vsd at the request of Venora Maples, MD.   Medical history include VSD echo in 2016/03/16 showed Multiple muscular ventricular septal defects. S  She denies shortness of breath or chest pain. Denies any previous cardiac surgery. No major hospitalizations.   Prior CV Studies Reviewed: The following studies were reviewed today: TTE 2016/03/16  Veins  Normal systemic venous drainage. Three pulmonary veins seen returning to the  left atrium. Normal pulmonary vein velocity.   Atrium  Normal right atrial size. Normal left atrial size. No evidence of atrial  septal defect.   Atrioventricular Valves  Normal mitral valve. No mitral valve insufficiency. Normal mitral valve  doppler inflow pattern. Normal tricuspid valve. Trivial tricuspid  insufficiency (physiologic). Right ventricular pressure estimate based on  tricuspid regurgitant jet equals 20 mmHg, plus right atrial pressure.   Left Ventricle  Normal left ventricle structure. Normal left ventricular dimension. Normal  left ventricular systolic function. Normal left ventricular diastolic  function.   Right Ventricle  Normal right ventricle structure. Normal right ventricular dimension. Normal  right ventricular systolic function.  Ventricular Septum  Normal septal curvature. Multiple muscular ventricular septal defects.  Aortic Valve  Normal trileaftlet aortic valve. No aortic valve insufficiency. No aortic  valve stenosis.  Pulmonary Valve  Normal pulmonic valve. No  pulmonary valve stenosis. Trivial pulmonary  insufficiency (physiologic).  Conotruncus  Normal conotruncus.  Aorta   Left aortic arch with normal branching pattern. Normal aortic root size.  Unobstructed aortic arch. Normal pulsatile flow in descending aorta.  Ascending aortic velocity normal. Descending aortic velocity normal.  Pulmonary Artery  Normal pulmonary artery branches. No right pulmonary artery stenosis. No left  pulmonary artery stenosis.  Ductus Arteriosus  No patent ductus arteriosus.   Coronary Arteries  Normal coronary artery size and origins.  Fluid  No pericardial effusion.   History reviewed. No pertinent past medical history.  History reviewed. No pertinent surgical history.    OB History     Gravida  1   Para  0   Term  0   Preterm  0   AB  0   Living  0      SAB  0   IAB  0   Ectopic  0   Multiple  0   Live Births  0               Current Medications: No outpatient medications have been marked as taking for the 12/22/22 encounter (Office Visit) with Thomasene Ripple, DO.     Allergies:   Patient has no known allergies.   Social History   Socioeconomic History   Marital status: Single    Spouse name: Not on file   Number of children: Not on file   Years of education: Not on file   Highest education level: GED or equivalent  Occupational History   Not on file  Tobacco Use   Smoking status: Never    Passive exposure: Never  Smokeless tobacco: Never  Vaping Use   Vaping Use: Never used  Substance and Sexual Activity   Alcohol use: Never   Drug use: Never   Sexual activity: Yes    Birth control/protection: None  Other Topics Concern   Not on file  Social History Narrative   Not on file   Social Determinants of Health   Financial Resource Strain: Unknown (12/06/2022)   Overall Financial Resource Strain (CARDIA)    Difficulty of Paying Living Expenses: Patient refused  Food Insecurity: No Food Insecurity  (12/06/2022)   Hunger Vital Sign    Worried About Running Out of Food in the Last Year: Never true    Ran Out of Food in the Last Year: Never true  Transportation Needs: No Transportation Needs (12/06/2022)   PRAPARE - Hydrologist (Medical): No    Lack of Transportation (Non-Medical): No  Physical Activity: Unknown (12/06/2022)   Exercise Vital Sign    Days of Exercise per Week: 0 days    Minutes of Exercise per Session: Not on file  Stress: No Stress Concern Present (12/06/2022)   [place]    Feeling of Stress : Only a little  Social Connections: Moderately Isolated (12/06/2022)   Social Connection and Isolation Panel [NHANES]    Frequency of Communication with Friends and Family: More than three times a week    Frequency of Social Gatherings with Friends and Family: Once a week    Attends Religious Services: Never    Marine scientist or Organizations: No    Attends Music therapist: Not on file    Marital Status: Living with partner      History reviewed. No pertinent family history.    ROS:   Please see the history of present illness.     All other systems reviewed and are negative.   Labs/EKG Reviewed:    EKG:   EKG was ordered today.    Recent Labs: 12/06/2022: Hemoglobin 11.0; Platelets 289   Recent Lipid Panel No results found for: "CHOL", "TRIG", "HDL", "CHOLHDL", "LDLCALC", "LDLDIRECT"  Physical Exam:    VS:  BP 98/70   Pulse 87   Ht 5' 0.5" (1.537 m)   Wt 53.5 kg   LMP 05/15/2022   SpO2 100%   BMI 22.65 kg/m     Wt Readings from Last 3 Encounters:  12/22/22 53.5 kg (32 %, Z= -0.46)*  12/13/22 52.6 kg (28 %, Z= -0.57)*  11/30/22 52.5 kg (28 %, Z= -0.59)*   * Growth percentiles are based on CDC (Girls, 2-20 Years) data.     GEN:  Well nourished, well developed in no acute distress HEENT: Normal NECK: No JVD; No carotid  bruits LYMPHATICS: No lymphadenopathy CARDIAC: RRR, no murmurs, rubs, gallops RESPIRATORY:  Clear to auscultation without rales, wheezing or rhonchi  ABDOMEN: Soft, non-tender, non-distended MUSCULOSKELETAL:  No edema; No deformity  SKIN: Warm and dry NEUROLOGIC:  Alert and oriented x 3 PSYCHIATRIC:  Normal affect    Risk Assessment/Risk Calculators:     CARPREG II Risk Prediction Index Score:  1.  The patient's risk for a primary cardiac event is 5%.   Modified World Health Organization [facility]) Classification of Maternal CV Risk   Class II         ASSESSMENT & PLAN:    High risk pregnancy Muscular Ventricular septal defect   last echo in 2017 reported muscular vsd. The right  side of the heart reported to be normal. No symptoms.  Will get a repeat echo as she has not has over 5 years.      Patient Instructions  Medication Instructions:  Your physician recommends that you continue on your current medications as directed. Please refer to the Current Medication list given to you today.  *If you need a refill on your cardiac medications before your next appointment, please call your pharmacy*   Lab Work: None   Testing/Procedures: Your physician has requested that you have an echocardiogram. Echocardiography is a painless test that uses sound waves to create images of your heart. It provides your doctor with information about the size and shape of your heart and how well your heart's chambers and valves are working. This procedure takes approximately one hour. There are no restrictions for this procedure. Please do NOT wear cologne, perfume, aftershave, or lotions (deodorant is allowed). Please arrive 15 minutes prior to your appointment time.    Follow-Up: At [facility], you and your health needs are our priority.  As part of our continuing mission to provide you with exceptional heart care, we have created designated Provider Care Teams.  These Care Teams  include your primary Cardiologist (physician) and Advanced Practice Providers (APPs -  Physician Assistants and Nurse Practitioners) who all work together to provide you with the care you need, when you need it.  We recommend signing up for the patient portal called "MyChart".  Sign up information is provided on this After Visit Summary.  MyChart is used to connect with patients for Virtual Visits (Telemedicine).  Patients are able to view lab/test results, encounter notes, upcoming appointments, etc.  Non-urgent messages can be sent to your provider as well.   To learn more about what you can do with MyChart, go to [email or web address].    Your next appointment:   12 week(s)  Provider:   Berniece Salines  [facility] [address], [place], [place] [address]     Dispo:  No follow-ups on file.   Medication Adjustments/Labs and Tests Ordered: Current medicines are reviewed at length with the patient today.  Concerns regarding medicines are outlined above.  Tests Ordered: Orders Placed This Encounter  Procedures   ECHOCARDIOGRAM COMPLETE   Medication Changes: No orders of the defined types were placed in this encounter.

## 2022-12-26 ENCOUNTER — Encounter: Payer: Medicaid Other | Admitting: Family Medicine

## 2022-12-27 ENCOUNTER — Other Ambulatory Visit: Payer: Self-pay

## 2022-12-27 ENCOUNTER — Encounter: Payer: Self-pay | Admitting: Family Medicine

## 2022-12-27 ENCOUNTER — Ambulatory Visit (HOSPITAL_COMMUNITY): Payer: Medicaid Other | Attending: Cardiology

## 2022-12-27 ENCOUNTER — Ambulatory Visit (INDEPENDENT_AMBULATORY_CARE_PROVIDER_SITE_OTHER): Payer: Medicaid Other | Admitting: Family Medicine

## 2022-12-27 VITALS — BP 129/82 | HR 98 | Wt 117.2 lb

## 2022-12-27 DIAGNOSIS — O0990 Supervision of high risk pregnancy, unspecified, unspecified trimester: Secondary | ICD-10-CM

## 2022-12-27 DIAGNOSIS — I088 Other rheumatic multiple valve diseases: Secondary | ICD-10-CM | POA: Diagnosis not present

## 2022-12-27 DIAGNOSIS — Q210 Ventricular septal defect: Secondary | ICD-10-CM | POA: Diagnosis not present

## 2022-12-27 DIAGNOSIS — Z3A32 32 weeks gestation of pregnancy: Secondary | ICD-10-CM

## 2022-12-27 DIAGNOSIS — O24419 Gestational diabetes mellitus in pregnancy, unspecified control: Secondary | ICD-10-CM

## 2022-12-27 DIAGNOSIS — O0993 Supervision of high risk pregnancy, unspecified, third trimester: Secondary | ICD-10-CM

## 2022-12-27 LAB — ECHOCARDIOGRAM COMPLETE
Area-P 1/2: 5.66 cm2
S' Lateral: 3 cm

## 2022-12-27 NOTE — Progress Notes (Signed)
   PRENATAL VISIT NOTE  Subjective:  Audrey Sanders is a 22 y.o. G1P0000 at [redacted]w[redacted]d being seen today for ongoing prenatal care.  She is currently monitored for the following issues for this high-risk pregnancy and has Supervision of high risk pregnancy, antepartum; Rubella non-immune status, antepartum; VSD (ventricular septal defect), muscular; and Gestational diabetes mellitus (GDM) affecting pregnancy, antepartum on their problem list.  Patient reports no complaints.  Contractions: Not present. Vag. Bleeding: None.  Movement: Present. Denies leaking of fluid.   The following portions of the patient's history were reviewed and updated as appropriate: allergies, current medications, past family history, past medical history, past social history, past surgical history and problem list.   Objective:   Vitals:   12/27/22 1509  BP: 129/82  Pulse: 98  Weight: 117 lb 3.2 oz (53.2 kg)    Fetal Status: Fetal Heart Rate (bpm): 152   Movement: Present     General:  Alert, oriented and cooperative. Patient is in no acute distress.  Skin: Skin is warm and dry. No rash noted.   Cardiovascular: Normal heart rate noted  Respiratory: Normal respiratory effort, no problems with respiration noted  Abdomen: Soft, gravid, appropriate for gestational age.  Pain/Pressure: Present     Pelvic: Cervical exam deferred        Extremities: Normal range of motion.     Mental Status: Normal mood and affect. Normal behavior. Normal judgment and thought content.   Assessment and Plan:  Pregnancy: G1P0000 at [redacted]w[redacted]d 1. Supervision of high risk pregnancy, antepartum Up to date date Vigorous movement  2. Gestational diabetes mellitus (GDM) affecting pregnancy, antepartum Fastings 80-90s 2hr PP 110s She did not bring log. Has met with Levada Dy.   Today fasting was 94 and at 12 PM was 112 Has [place] scheduled 2/14  Preterm labor symptoms and general obstetric precautions including but not limited to vaginal  bleeding, contractions, leaking of fluid and fetal movement were reviewed in detail with the patient. Please refer to After Visit Summary for other counseling recommendations.   No follow-ups on file.  Future Appointments  Date Time Provider [place]  01/10/2023  2:55 PM Caren Macadam, MD [facility]  01/10/2023  3:30 PM [facility] US3 [facility]  01/24/2023  3:35 PM Caren Macadam, MD [facility]  01/31/2023  1:55 PM Caren Macadam, MD [facility]  02/07/2023  3:55 PM Caren Macadam, MD [facility]  02/13/2023  3:15 PM Clarnce Flock, MD [facility]  02/19/2023 10:35 AM Donnamae Jude, MD [facility]  02/19/2023  1:15 PM [facility]  03/09/2023  2:20 PM Tobb, Godfrey Pick, DO [facility] None    Caren Macadam, MD

## 2022-12-27 NOTE — Progress Notes (Signed)
Fasting sugars 80-90's 2 hours 110  Did not bring log to appt today, states she is not having any issues with sugars

## 2022-12-28 DIAGNOSIS — Z419 Encounter for procedure for purposes other than remedying health state, unspecified: Secondary | ICD-10-CM | POA: Diagnosis not present

## 2023-01-03 ENCOUNTER — Encounter: Payer: Self-pay | Admitting: Family Medicine

## 2023-01-10 ENCOUNTER — Other Ambulatory Visit: Payer: Self-pay

## 2023-01-10 ENCOUNTER — Encounter: Payer: Self-pay | Admitting: Family Medicine

## 2023-01-10 ENCOUNTER — Ambulatory Visit (INDEPENDENT_AMBULATORY_CARE_PROVIDER_SITE_OTHER): Payer: Medicaid Other | Admitting: Family Medicine

## 2023-01-10 ENCOUNTER — Telehealth: Payer: Self-pay

## 2023-01-10 ENCOUNTER — Ambulatory Visit: Payer: Medicaid Other | Attending: Family Medicine

## 2023-01-10 ENCOUNTER — Encounter: Payer: Self-pay | Admitting: *Deleted

## 2023-01-10 ENCOUNTER — Ambulatory Visit: Payer: Medicaid Other | Admitting: *Deleted

## 2023-01-10 VITALS — BP 111/72 | HR 79 | Wt 118.8 lb

## 2023-01-10 VITALS — BP 111/71 | HR 80

## 2023-01-10 DIAGNOSIS — Z3A34 34 weeks gestation of pregnancy: Secondary | ICD-10-CM

## 2023-01-10 DIAGNOSIS — O0990 Supervision of high risk pregnancy, unspecified, unspecified trimester: Secondary | ICD-10-CM

## 2023-01-10 DIAGNOSIS — O24410 Gestational diabetes mellitus in pregnancy, diet controlled: Secondary | ICD-10-CM | POA: Diagnosis not present

## 2023-01-10 DIAGNOSIS — Q210 Ventricular septal defect: Secondary | ICD-10-CM

## 2023-01-10 DIAGNOSIS — O09893 Supervision of other high risk pregnancies, third trimester: Secondary | ICD-10-CM

## 2023-01-10 DIAGNOSIS — O0993 Supervision of high risk pregnancy, unspecified, third trimester: Secondary | ICD-10-CM

## 2023-01-10 DIAGNOSIS — O24419 Gestational diabetes mellitus in pregnancy, unspecified control: Secondary | ICD-10-CM | POA: Diagnosis not present

## 2023-01-10 DIAGNOSIS — Z2839 Other underimmunization status: Secondary | ICD-10-CM

## 2023-01-10 LAB — GLUCOSE, CAPILLARY: Glucose-Capillary: 76 mg/dL (ref 70–99)

## 2023-01-10 NOTE — Telephone Encounter (Signed)
Opened in error.  Adonis Huguenin RN 01/10/23 at [phone]

## 2023-01-10 NOTE — Progress Notes (Signed)
   Subjective:  Audrey Sanders is a 22 y.o. G1P0000 at [redacted]w[redacted]d being seen today for ongoing prenatal care.  She is currently monitored for the following issues for this high-risk pregnancy and has Supervision of high risk pregnancy, antepartum; Rubella non-immune status, antepartum; VSD (ventricular septal defect), muscular; and Gestational diabetes mellitus (GDM) affecting pregnancy, antepartum on their problem list.  Patient reports no complaints.  Contractions: Not present. Vag. Bleeding: None.  Movement: Present. Denies leaking of fluid.   The following portions of the patient's history were reviewed and updated as appropriate: allergies, current medications, past family history, past medical history, past social history, past surgical history and problem list. Problem list updated.  Objective:   Vitals:   01/10/23 1519  BP: 111/72  Pulse: 79  Weight: 118 lb 12.8 oz (53.9 kg)    Fetal Status: Fetal Heart Rate (bpm): 128   Movement: Present     General:  Alert, oriented and cooperative. Patient is in no acute distress.  Skin: Skin is warm and dry. No rash noted.   Cardiovascular: Normal heart rate noted  Respiratory: Normal respiratory effort, no problems with respiration noted  Abdomen: Soft, gravid, appropriate for gestational age. Pain/Pressure: Present     Pelvic: Vag. Bleeding: None     Cervical exam deferred        Extremities: Normal range of motion.  Edema: None  Mental Status: Normal mood and affect. Normal behavior. Normal judgment and thought content.   Urinalysis:      Assessment and Plan:  Pregnancy: G1P0000 at [redacted]w[redacted]d  1. Supervision of high risk pregnancy, antepartum BP and FHR normal Discussed swabs for next visit  2. Gestational diabetes mellitus (GDM) affecting pregnancy, antepartum Did not bring sugar logs From recall fastings are 80-90's Post prandials are around 115 Random glucose (says she has not eaten today) was 76 Has growth [place] later this  morning  3. VSD (ventricular septal defect), muscular Seen by Cardio OB and ordered for TTE TTE completed 12/27/2022, shows VSD but normal chamber sizes and EF Has follow up with Dr. Harriet Masson  soon Has not heard from anyone regarding fetal echo, will coordinate this  4. Rubella non-immune status, antepartum Offer MMR PP  Preterm labor symptoms and general obstetric precautions including but not limited to vaginal bleeding, contractions, leaking of fluid and fetal movement were reviewed in detail with the patient. Please refer to After Visit Summary for other counseling recommendations.  Return in 2 weeks (on 01/24/2023) for Dyad patient, ob visit.   Clarnce Flock, MD

## 2023-01-10 NOTE — Patient Instructions (Signed)

## 2023-01-15 ENCOUNTER — Telehealth: Payer: Self-pay | Admitting: Family Medicine

## 2023-01-15 NOTE — Telephone Encounter (Signed)
Sent pt [employer]. Called placed to number on file, number unavailable.   Colletta Maryland, RNC

## 2023-01-15 NOTE — Telephone Encounter (Signed)
[facility] called in stating they have attempted to reach this patient several times and the number on file is unavailable. They wanted to let the provider know.

## 2023-01-24 ENCOUNTER — Other Ambulatory Visit (HOSPITAL_COMMUNITY)
Admission: RE | Admit: 2023-01-24 | Discharge: 2023-01-24 | Disposition: A | Discharge status: Home / Self Care | Payer: Medicaid Other | Source: Ambulatory Visit | Attending: Family Medicine | Admitting: Family Medicine

## 2023-01-24 ENCOUNTER — Ambulatory Visit (INDEPENDENT_AMBULATORY_CARE_PROVIDER_SITE_OTHER): Payer: Medicaid Other | Admitting: Family Medicine

## 2023-01-24 ENCOUNTER — Encounter: Payer: Self-pay | Admitting: Family Medicine

## 2023-01-24 ENCOUNTER — Other Ambulatory Visit: Payer: Self-pay

## 2023-01-24 VITALS — BP 120/73 | HR 97 | Wt 122.0 lb

## 2023-01-24 DIAGNOSIS — Z3A36 36 weeks gestation of pregnancy: Secondary | ICD-10-CM

## 2023-01-24 DIAGNOSIS — O24419 Gestational diabetes mellitus in pregnancy, unspecified control: Secondary | ICD-10-CM

## 2023-01-24 DIAGNOSIS — O0990 Supervision of high risk pregnancy, unspecified, unspecified trimester: Secondary | ICD-10-CM | POA: Diagnosis not present

## 2023-01-24 DIAGNOSIS — O0993 Supervision of high risk pregnancy, unspecified, third trimester: Secondary | ICD-10-CM

## 2023-01-24 NOTE — Progress Notes (Signed)
   PRENATAL VISIT NOTE  Subjective:  Audrey Sanders is a 22 y.o. G1P0000 at 2w2dbeing seen today for ongoing prenatal care.  She is currently monitored for the following issues for this low-risk pregnancy and has Supervision of high risk pregnancy, antepartum; Rubella non-immune status, antepartum; VSD (ventricular septal defect), muscular; and Gestational diabetes mellitus (GDM) affecting pregnancy, antepartum on their problem list.  Patient reports no complaints.  Contractions: Irritability. Vag. Bleeding: None.  Movement: Present. Denies leaking of fluid.   The following portions of the patient's history were reviewed and updated as appropriate: allergies, current medications, past family history, past medical history, past social history, past surgical history and problem list.   Objective:   Vitals:   01/24/23 1613  BP: 120/73  Pulse: 97  Weight: 122 lb (55.3 kg)    Fetal Status: Fetal Heart Rate (bpm): 145   Movement: Present     General:  Alert, oriented and cooperative. Patient is in no acute distress.  Skin: Skin is warm and dry. No rash noted.   Cardiovascular: Normal heart rate noted  Respiratory: Normal respiratory effort, no problems with respiration noted  Abdomen: Soft, gravid, appropriate for gestational age.  Pain/Pressure: Present     Pelvic: Cervical exam deferred        Extremities: Normal range of motion.     Mental Status: Normal mood and affect. Normal behavior. Normal judgment and thought content.   Assessment and Plan:  Pregnancy: G1P0000 at 378w2d. Supervision of high risk pregnancy, antepartum Up to date No conerns - GC[facility]lam[facility], beta strep (group b only)  2. Gestational diabetes mellitus (GDM) affecting pregnancy, antepartum Reports sugars are "good" No log today Reviewed EFW was WNL--2169g 19th%  Preterm labor symptoms and general obstetric precautions including but not limited to vaginal  bleeding, contractions, leaking of fluid and fetal movement were reviewed in detail with the patient. Please refer to After Visit Summary for other counseling recommendations.   Return in about 1 week (around 01/31/2023) for Mom+Baby Combined Care.  Future Appointments  Date Time Provider De[place]3/04/2023  1:55 PM NeCaren MacadamMD WMCab[facility] CenterMM[facility]3/13/2024  3:55 PM NeCaren MacadamMD WM[facility]3/19/2024  3:15 PM EcClarnce FlockMD WMSaint ALPh[facility] CenterMN[facility]3/25/2024 10:35 AM PrDonnamae JudeMD WMOlathe Medical CenterMT[facility]3/25/2024  1:15 PM WMC-WOCA NST WMSt. [facility] Medical CenterMW[facility]4/10/2023  2:20 PM Tobb, KaGodfrey PickDO CVD-WMC None    KiCaren MacadamMD

## 2023-01-25 DIAGNOSIS — O0990 Supervision of high risk pregnancy, unspecified, unspecified trimester: Secondary | ICD-10-CM | POA: Diagnosis not present

## 2023-01-26 DIAGNOSIS — Z419 Encounter for procedure for purposes other than remedying health state, unspecified: Secondary | ICD-10-CM | POA: Diagnosis not present

## 2023-01-26 LAB — GC/CHLAMYDIA PROBE AMP (~~LOC~~) NOT AT ARMC
Chlamydia: NEGATIVE
Comment: NEGATIVE
Comment: NORMAL
Neisseria Gonorrhea: NEGATIVE

## 2023-01-30 LAB — CULTURE, BETA STREP (GROUP B ONLY): Strep Gp B Culture: NEGATIVE

## 2023-01-31 ENCOUNTER — Encounter: Payer: Self-pay | Admitting: Family Medicine

## 2023-01-31 ENCOUNTER — Ambulatory Visit (INDEPENDENT_AMBULATORY_CARE_PROVIDER_SITE_OTHER): Payer: Medicaid Other | Admitting: Family Medicine

## 2023-01-31 ENCOUNTER — Other Ambulatory Visit: Payer: Self-pay

## 2023-01-31 VITALS — BP 110/69 | HR 83 | Wt 121.8 lb

## 2023-01-31 DIAGNOSIS — O09899 Supervision of other high risk pregnancies, unspecified trimester: Secondary | ICD-10-CM

## 2023-01-31 DIAGNOSIS — O24419 Gestational diabetes mellitus in pregnancy, unspecified control: Secondary | ICD-10-CM

## 2023-01-31 DIAGNOSIS — O0990 Supervision of high risk pregnancy, unspecified, unspecified trimester: Secondary | ICD-10-CM

## 2023-01-31 DIAGNOSIS — O09893 Supervision of other high risk pregnancies, third trimester: Secondary | ICD-10-CM

## 2023-01-31 DIAGNOSIS — O0993 Supervision of high risk pregnancy, unspecified, third trimester: Secondary | ICD-10-CM

## 2023-01-31 DIAGNOSIS — Z2839 Other underimmunization status: Secondary | ICD-10-CM

## 2023-01-31 DIAGNOSIS — Z3A37 37 weeks gestation of pregnancy: Secondary | ICD-10-CM

## 2023-01-31 DIAGNOSIS — Q210 Ventricular septal defect: Secondary | ICD-10-CM

## 2023-01-31 NOTE — Progress Notes (Addendum)
   PRENATAL VISIT NOTE  Subjective:  Audrey Sanders is a 22 y.o. G1P0000 at 71w2dbeing seen today for ongoing prenatal care.  She is currently monitored for the following issues for this high-risk pregnancy and has Supervision of high risk pregnancy, antepartum; Rubella non-immune status, antepartum; VSD (ventricular septal defect), muscular; and Gestational diabetes mellitus (GDM) affecting pregnancy, antepartum on their problem list.  Patient reports no complaints.  Contractions: Not present. Vag. Bleeding: None.  Movement: Present. Denies leaking of fluid.   The following portions of the patient's history were reviewed and updated as appropriate: allergies, current medications, past family history, past medical history, past social history, past surgical history and problem list.   Objective:   Vitals:   01/31/23 1411  BP: 110/69  Pulse: 83  Weight: 121 lb 12.8 oz (55.2 kg)    Fetal Status: Fetal Heart Rate (bpm): 141   Movement: Present     General:  Alert, oriented and cooperative. Patient is in no acute distress.  Skin: Skin is warm and dry. No rash noted.   Cardiovascular: Normal heart rate noted  Respiratory: Normal respiratory effort, no problems with respiration noted  Abdomen: Soft, gravid, appropriate for gestational age.  Pain/Pressure: Present     Pelvic: Cervical exam deferred        Extremities: Normal range of motion.  Edema: Trace  Mental Status: Normal mood and affect. Normal behavior. Normal judgment and thought content.   Assessment and Plan:  Pregnancy: G1P0000 at 372w2d. Supervision of high risk pregnancy, antepartum Doing well FH appropriate Baby moving well - GC[facility]lam[facility] amp [facility], beta strep (group b only)  2. Gestational diabetes mellitus (GDM) affecting pregnancy, antepartum Sugars have been well controlled EFW 2169g 19th% Reported fastings 80-90s, 2hr PPs 110s-120s Requested IOL at 40 weeks Estimated Date  of Delivery: 02/19/23 -- [facility] interested but will let usKoreanow for sure. Will need on 3/24 if desired  3. Rubella non-immune status, antepartum MMR PP  4. VSD (ventricular septal defect), muscular [facility] follow up appt  Term labor symptoms and general obstetric precautions including but not limited to vaginal bleeding, contractions, leaking of fluid and fetal movement were reviewed in detail with the patient. Please refer to After Visit Summary for other counseling recommendations.   Return in about 1 week (around 02/07/2023) for Mom+Baby Combined Care.  Future Appointments  Date Time Provider DeKahuku3/13/2024  3:55 PM NeCaren MacadamMD WMRichland [facility] DelhiM[facility]3/19/2024  3:15 PM EcClarnce FlockMD WMWasatch [facility] LLCM[facility]3/25/2024 12:00 AM MC-LD SCOakwood ParkC-INDC None  02/19/2023 10:35 AM PrDonnamae JudeMD WMSpecialty Surgery Center [facility] San AntonioMReg[facility]3/25/2024  1:15 PM WMC-WOCA NST WMWichita Falls Endoscopy CenterMMidatlantic [facility]4/10/2023  2:20 PM Tobb, KaGodfrey PickDO CVD-WMC None    KiCaren MacadamMD

## 2023-01-31 NOTE — Patient Instructions (Signed)
For the Ultrasound of your baby's heart  Please call [phone]  If you are not able to get this scheduled, we can plan to do an [place] of your baby's heart after delivery.   OUTPATIENT FOLEY BULB INDUCTION OF LABOR:  Information Sheet for Mothers and Family               What's a Foley Bulb Induction? A Foley bulb induction is a procedure where your provider inserts a catheter into your cervix. Once inside your womb, your provider inflates the balloon with a saline solution.   This puts pressure on your cervix and encourages dilation. The catheter falls out once your cervix dilates to 3-4 centimeters.     With any procedure, it's important that you know what to expect. The insertion of a Foley catheter can be a bit uncomfortable, and some women experience sharp pelvic pain. The pain may subside once the catheter is in place. You may experience some cramping when the Foley catheter is in place.  This is normal.     GO TO THE MATERNITY ADMISSIONS UNIT FOR THE FOLLOWING: Heavy vaginal bleeding Rupture of membranes (fluid that wets your underwear) Painful uterine contractions every 5 minutes or less Severe abdominal discomfort Decreased movement of the baby

## 2023-02-07 ENCOUNTER — Ambulatory Visit (INDEPENDENT_AMBULATORY_CARE_PROVIDER_SITE_OTHER): Payer: Medicaid Other | Admitting: Obstetrics and Gynecology

## 2023-02-07 ENCOUNTER — Encounter: Payer: Self-pay | Admitting: Family Medicine

## 2023-02-07 ENCOUNTER — Other Ambulatory Visit: Payer: Self-pay

## 2023-02-07 VITALS — BP 116/72 | HR 83 | Wt 122.2 lb

## 2023-02-07 DIAGNOSIS — O0990 Supervision of high risk pregnancy, unspecified, unspecified trimester: Secondary | ICD-10-CM

## 2023-02-07 DIAGNOSIS — O0993 Supervision of high risk pregnancy, unspecified, third trimester: Secondary | ICD-10-CM

## 2023-02-07 DIAGNOSIS — Q210 Ventricular septal defect: Secondary | ICD-10-CM

## 2023-02-07 DIAGNOSIS — Z2839 Other underimmunization status: Secondary | ICD-10-CM

## 2023-02-07 DIAGNOSIS — O24419 Gestational diabetes mellitus in pregnancy, unspecified control: Secondary | ICD-10-CM

## 2023-02-07 DIAGNOSIS — O09893 Supervision of other high risk pregnancies, third trimester: Secondary | ICD-10-CM

## 2023-02-07 DIAGNOSIS — O09899 Supervision of other high risk pregnancies, unspecified trimester: Secondary | ICD-10-CM

## 2023-02-07 DIAGNOSIS — Z3A38 38 weeks gestation of pregnancy: Secondary | ICD-10-CM

## 2023-02-07 NOTE — Progress Notes (Signed)
   Subjective:  Audrey Sanders is a 22 y.o. G1P0000 at [redacted]w[redacted]d being seen today for ongoing prenatal care.  She is currently monitored for the following issues for this high-risk pregnancy and has Supervision of high risk pregnancy, antepartum; Rubella non-immune status, antepartum; VSD (ventricular septal defect), muscular; and Gestational diabetes mellitus (GDM) affecting pregnancy, antepartum on their problem list.  Patient reports no complaints.  Contractions: Not present. Vag. Bleeding: None.  Movement: Present. Denies leaking of fluid.   The following portions of the patient's history were reviewed and updated as appropriate: allergies, current medications, past family history, past medical history, past social history, past surgical history and problem list. Problem list updated.  Objective:   Vitals:   02/07/23 1626  BP: 116/72  Pulse: 83  Weight: 122 lb 3.2 oz (55.4 kg)    Fetal Status: Fetal Heart Rate (bpm): 134 Fundal Height: 38 cm Movement: Present     General:  Alert, oriented and cooperative. Patient is in no acute distress.  Skin: Skin is warm and dry. No rash noted.   Cardiovascular: Normal heart rate noted  Respiratory: Normal respiratory effort, no problems with respiration noted  Abdomen: Soft, gravid, appropriate for gestational age. Pain/Pressure: Present     Pelvic: Vag. Bleeding: None     Cervical exam deferred        Extremities: Normal range of motion.  Edema: None  Mental Status: Normal mood and affect. Normal behavior. Normal judgment and thought content.   Urinalysis:      Assessment and Plan:  Pregnancy: G1P0000 at [redacted]w[redacted]d  1. Supervision of high risk pregnancy, antepartum BP and FHR normal Baby moving well, occasional contractions.  No concerns  Interested in OP FB, discussed will likely be inpatient d/t office closed day before induction   IOL scheduled for 02/19/23  2. Gestational diabetes mellitus (GDM) affecting pregnancy, antepartum Did not  bring log today, reports "good" sugars IOL scheduled for 02/19/23 at midnight EFW on 01/10/23 19% Follow up U/S not scheduled by MFM, at this time will likely not get in before IOL.   3. Rubella non-immune status, antepartum MMR PP   4. VSD (ventricular septal defect), muscular Seen by [facility] cardiology 12/23/22   Term labor symptoms and general obstetric precautions including but not limited to vaginal bleeding, contractions, leaking of fluid and fetal movement were reviewed in detail with the patient. Please refer to After Visit Summary for other counseling recommendations.   Return in one week (3/19) for Mom+Baby Combined care visit   Future Appointments  Date Time Provider [place]  02/13/2023  3:15 PM Clarnce Flock, MD [facility]  02/19/2023 12:00 AM MC-LD [place] MC-INDC None  03/09/2023  2:20 PM Tobb, Godfrey Pick, DO CVD-WMC None    Jeanann Lewandowsky, FNP

## 2023-02-12 ENCOUNTER — Inpatient Hospital Stay (HOSPITAL_COMMUNITY)
Admission: AD | Admit: 2023-02-12 | Discharge: 2023-02-14 | DRG: 807 | Disposition: A | Discharge status: Home / Self Care | Payer: Medicaid Other | Attending: Obstetrics and Gynecology | Admitting: Obstetrics and Gynecology

## 2023-02-12 ENCOUNTER — Ambulatory Visit: Payer: Self-pay

## 2023-02-12 ENCOUNTER — Telehealth (HOSPITAL_COMMUNITY): Payer: Self-pay | Admitting: *Deleted

## 2023-02-12 ENCOUNTER — Encounter (HOSPITAL_COMMUNITY): Payer: Self-pay | Admitting: Obstetrics & Gynecology

## 2023-02-12 ENCOUNTER — Encounter (HOSPITAL_COMMUNITY): Payer: Self-pay

## 2023-02-12 DIAGNOSIS — O26893 Other specified pregnancy related conditions, third trimester: Secondary | ICD-10-CM | POA: Diagnosis not present

## 2023-02-12 DIAGNOSIS — Z3A39 39 weeks gestation of pregnancy: Secondary | ICD-10-CM | POA: Diagnosis not present

## 2023-02-12 DIAGNOSIS — O24420 Gestational diabetes mellitus in childbirth, diet controlled: Secondary | ICD-10-CM | POA: Diagnosis not present

## 2023-02-12 LAB — GLUCOSE, CAPILLARY: Glucose-Capillary: 78 mg/dL (ref 70–99)

## 2023-02-12 LAB — CBC
HCT: 36.1 % (ref 36.0–46.0)
Hemoglobin: 11.7 g/dL — ABNORMAL LOW (ref 12.0–15.0)
MCH: 29.7 pg (ref 26.0–34.0)
MCHC: 32.4 g/dL (ref 30.0–36.0)
MCV: 91.6 fL (ref 80.0–100.0)
Platelets: 237 10*3/uL (ref 150–400)
RBC: 3.94 MIL/uL (ref 3.87–5.11)
RDW: 12.5 % (ref 11.5–15.5)
WBC: 9.5 10*3/uL (ref 4.0–10.5)
nRBC: 0 % (ref 0.0–0.2)

## 2023-02-12 LAB — TYPE AND SCREEN
ABO/RH(D): O POS
Antibody Screen: NEGATIVE

## 2023-02-12 LAB — RAPID HIV SCREEN (HIV 1/2 AB+AG)
HIV 1/2 Antibodies: NONREACTIVE
HIV-1 P24 Antigen - HIV24: NONREACTIVE

## 2023-02-12 MED ORDER — FENTANYL CITRATE (PF) 100 MCG/2ML IJ SOLN
INTRAMUSCULAR | Status: AC
  Administered 2023-02-12: 100 ug via INTRAVENOUS
  Filled 2023-02-12: qty 2

## 2023-02-12 MED ORDER — ONDANSETRON HCL 4 MG/2ML IJ SOLN
4.0000 mg | Freq: Four times a day (QID) | INTRAMUSCULAR | Status: DC | PRN
  Filled 2023-02-12: qty 2

## 2023-02-12 MED ORDER — OXYTOCIN-SODIUM CHLORIDE 30-0.9 UT/500ML-% IV SOLN
2.5000 [IU]/h | INTRAVENOUS | Status: DC
  Administered 2023-02-13 (×2): 2.5 [IU]/h via INTRAVENOUS
  Filled 2023-02-12: qty 500

## 2023-02-12 MED ORDER — LACTATED RINGERS IV SOLN: 500.0000 mL | INTRAVENOUS | Status: DC | PRN

## 2023-02-12 MED ORDER — ONDANSETRON 4 MG PO TBDP
4.0000 mg | ORAL_TABLET | Freq: Once | ORAL | Status: AC
  Administered 2023-02-12: 4 mg via ORAL
  Filled 2023-02-12: qty 1

## 2023-02-12 MED ORDER — SOD CITRATE-CITRIC ACID 500-334 MG/5ML PO SOLN: 30.0000 mL | ORAL | Status: DC | PRN

## 2023-02-12 MED ORDER — LACTATED RINGERS IV SOLN: INTRAVENOUS | Status: DC

## 2023-02-12 MED ORDER — ACETAMINOPHEN 325 MG PO TABS: 650.0000 mg | ORAL_TABLET | ORAL | Status: DC | PRN

## 2023-02-12 MED ORDER — OXYCODONE-ACETAMINOPHEN 5-325 MG PO TABS: 2.0000 | ORAL_TABLET | ORAL | Status: DC | PRN

## 2023-02-12 MED ORDER — LIDOCAINE HCL (PF) 1 % IJ SOLN
30.0000 mL | INTRAMUSCULAR | Status: DC | PRN
  Filled 2023-02-12: qty 30

## 2023-02-12 MED ORDER — OXYCODONE-ACETAMINOPHEN 5-325 MG PO TABS: 1.0000 | ORAL_TABLET | ORAL | Status: DC | PRN

## 2023-02-12 MED ORDER — OXYTOCIN BOLUS FROM INFUSION
333.0000 mL | Freq: Once | INTRAVENOUS | Status: AC
  Administered 2023-02-13: 333 mL via INTRAVENOUS

## 2023-02-12 MED ORDER — FENTANYL CITRATE (PF) 100 MCG/2ML IJ SOLN
100.0000 ug | INTRAMUSCULAR | Status: DC | PRN
  Administered 2023-02-12: 100 ug via INTRAVENOUS
  Filled 2023-02-12: qty 2

## 2023-02-12 NOTE — Progress Notes (Signed)
Patient Vitals for the past 4 hrs:  BP Temp Temp src Pulse SpO2  02/12/23 2125 -- -- -- -- 99 %  02/12/23 2120 123/73 98.8 F (37.1 C) Oral (!) 116 --   Doing well w/IV pain medicine.  Requests AROM. Cx 8.5/100-1/  AROM w/clear fluid.  FHR Cat 1.  Ctx q 2 minutes.  Anticipate SVD soon.

## 2023-02-12 NOTE — Telephone Encounter (Signed)
  Chief Complaint: contractions Symptoms: 39w pregnant, contractions q5-90mins lasting 30 sec or more, pain in lower abdomen, HA Frequency: since 1300 today Pertinent Negatives: Patient denies leaking fluids or swelling  Disposition: [x] ED /[] Urgent Care (no appt availability in office) / [] Appointment(In office/virtual)/ []  [place] [facility]/ [] Home Care/ [] Refused Recommended Disposition /[] [place] [facility]/ []  Follow-up with PCP Additional Notes: pt states she is having pain come and go every 5-6 mins below her belly button. This is baby #1. Advised she can call OB but advised to go to [facility] L&D for evaluation. Pt verbalized understanding.   Reason for Disposition  [1] First baby (primipara) AND [2] contractions < 6 minutes apart  AND [3] present 2 hours  Answer Assessment - Initial Assessment Questions 1. ONSET: "When did the symptoms begin?"        1300 today 2. CONTRACTIONS: "Describe the contractions that you are having." (e.g., duration, frequency, regularity, severity)    Every 5-6 mins, lasting 30 secs 3. PREGNANCY: "How many weeks pregnant are you?"     39w 4. EDD: "What date are you expecting to deliver?"     02/19/23 5. PARITY: "Have you had a baby before?" If Yes, ask: "How long did the labor last?"     Baby #1 6. FETAL MOVEMENT: "Has the baby's movement decreased or changed significantly from normal?"     normal 7. OTHER SYMPTOMS: "Do you have any other symptoms?" (e.g., leaking fluid from vagina, vaginal bleeding, fever, hand/facial swelling)     HA  Protocols used: Pregnancy - Labor-A-AH

## 2023-02-12 NOTE — Discharge Summary (Signed)
     Postpartum Discharge Summary  Date of Service updated***     Patient Name: Audrey Sanders DOB: 2001/06/09 MRN: [id number]  Date of admission: 02/12/2023 Delivery date:02/13/2023  Delivering provider: Arlyce Dice  Date of discharge: 02/13/2023  Admitting diagnosis: Gestational diabetes mellitus (GDM) affecting pregnancy [O24.419] Indication for care in labor or delivery [O75.9] Intrauterine pregnancy: [redacted]w[redacted]d     Secondary diagnosis:  Principal Problem:   Indication for care in labor or delivery  Additional problems: A1DM    Discharge diagnosis: Term Pregnancy Delivered and GDM A1                                              Post partum procedures:{Postpartum procedures:23558} Augmentation: AROM Complications: None  Hospital course: Onset of Labor With Vaginal Delivery      22 y.o. yo G1P0000 at [redacted]w[redacted]d was admitted in Active Labor on 02/12/2023. Labor course was complicated by nothing  Membrane Rupture Time/Date: 11:21 PM ,02/12/2023   Delivery Method:Vaginal, Spontaneous  Episiotomy: None  Lacerations:  None  Patient had a postpartum course complicated by ***.  She is ambulating, tolerating a regular diet, passing flatus, and urinating well. Patient is discharged home in stable condition on 02/13/23.  Newborn Data: Birth date:02/13/2023  Birth time:1:27 AM  Gender:Female  Living status:Living  Apgars:8 ,9  Weight:3020 g   Magnesium Sulfate received: No BMZ received: No Rhophylac:N/A MMR:{MMR:30440033} T-DaP: declined Flu: N/A Transfusion:No  Physical exam  Vitals:   02/12/23 1756 02/12/23 2120 02/12/23 2125  BP: 124/78 123/73   Pulse: (!) 107 (!) 116   Resp: 17    Temp: 99 F (37.2 C) 98.8 F (37.1 C)   TempSrc: Oral Oral   SpO2: 100%  99%  Weight: 55.1 kg    Height: 5\' 1"  (1.549 m)     General: {Exam; general:21111117} Lochia: {Desc; appropriate/inappropriate:30686::"appropriate"} Uterine Fundus: {Desc; firm/soft:30687} Incision: {Exam;  incision:21111123} DVT Evaluation: {Exam; YS:[id number] Labs: Lab Results  Component Value Date   WBC 9.5 02/12/2023   HGB 11.7 (L) 02/12/2023   HCT 36.1 02/12/2023   MCV 91.6 02/12/2023   PLT 237 02/12/2023       No data to display         Edinburgh Score:     No data to display           After visit meds:  Allergies as of 02/13/2023   No Known Allergies   Med Rec must be completed prior to using this [facility]***        Discharge home in stable condition Infant Feeding: {Baby feeding:23562} Infant Disposition:{CHL IP OB HOME WITH DX:[id number] Discharge instruction: per After Visit Summary and Postpartum booklet. Activity: Advance as tolerated. Pelvic rest for 6 weeks.  Diet: {OB BY:630183 Future Appointments: Future Appointments  Date Time Provider [place]  02/13/2023  3:15 PM Clarnce Flock, MD [facility]  02/19/2023 12:00 AM MC-LD SCHED ROOM MC-INDC None  03/09/2023  2:20 PM Tobb, Godfrey Pick, DO CVD-WMC None   Follow up Visit:   Please schedule this patient for a In person postpartum visit in 4 weeks with the following provider: Any provider. Additional Postpartum F/U:2 hour GTT  Low risk pregnancy complicated by: GDM Delivery mode:  Vaginal, Spontaneous  Anticipated Birth Control:  Unsure   02/13/2023 Christin Fudge, CNM

## 2023-02-12 NOTE — H&P (Cosign Needed Addendum)
OBSTETRIC ADMISSION HISTORY AND PHYSICAL  Audrey Sanders is a 22 y.o. female G1P0000 with IUP at [redacted]w[redacted]d by LMP presenting for SOL. She reports +FMs, No LOF, no VB, no blurry vision, headaches or peripheral edema, and RUQ pain.  She plans on formula feeding. She is undecided for birth control. She received her prenatal care at [place]: By LMP --->  Estimated Date of Delivery: 02/19/23  Sono:    @[redacted]w[redacted]d , CWD, normal anatomy, cephalic presentation, 123XX123, 19% EFW   Prenatal History/Complications: 123XX123  Past Medical History: History reviewed. No pertinent past medical history.  Past Surgical History: History reviewed. No pertinent surgical history.  Obstetrical History: OB History     Gravida  1   Para  0   Term  0   Preterm  0   AB  0   Living  0      SAB  0   IAB  0   Ectopic  0   Multiple  0   Live Births  0           Social History Social History   Socioeconomic History   Marital status: Single    Spouse name: Not on file   Number of children: Not on file   Years of education: Not on file   Highest education level: GED or equivalent  Occupational History   Not on file  Tobacco Use   Smoking status: Never    Passive exposure: Never   Smokeless tobacco: Never  Vaping Use   Vaping Use: Never used  Substance and Sexual Activity   Alcohol use: Never   Drug use: Never   Sexual activity: Not Currently    Birth control/protection: None  Other Topics Concern   Not on file  Social History Narrative   Not on file   Social Determinants of Health   Financial Resource Strain: Patient Declined (12/06/2022)   Overall Financial Resource Strain (CARDIA)    Difficulty of Paying Living Expenses: Patient declined  Food Insecurity: No Food Insecurity (12/06/2022)   Hunger Vital Sign    Worried About Running Out of Food in the Last Year: Never true    Ran Out of Food in the Last Year: Never true  Transportation Needs: No Transportation Needs  (12/06/2022)   PRAPARE - Hydrologist (Medical): No    Lack of Transportation (Non-Medical): No  Physical Activity: Unknown (12/06/2022)   Exercise Vital Sign    Days of Exercise per Week: 0 days    Minutes of Exercise per Session: Not on file  Stress: No Stress Concern Present (12/06/2022)   [place]    Feeling of Stress : Only a little  Social Connections: Moderately Isolated (12/06/2022)   Social Connection and Isolation Panel [NHANES]    Frequency of Communication with Friends and Family: More than three times a week    Frequency of Social Gatherings with Friends and Family: Once a week    Attends Religious Services: Never    Marine scientist or Organizations: No    Attends Music therapist: Not on file    Marital Status: Living with partner    Family History: History reviewed. No pertinent family history.  Allergies: No Known Allergies  Medications Prior to Admission  Medication Sig Dispense Refill Last Dose   ACCU-CHEK GUIDE test strip       Accu-Chek Softclix Lancets lancets USE TO test  FOUR TIMES DAILY        Review of Systems   All systems reviewed and negative except as stated in HPI  Blood pressure 124/78, pulse (!) 107, temperature 99 F (37.2 C), temperature source Oral, resp. rate 17, height 5\' 1"  (1.549 m), weight 55.1 kg, last menstrual period 05/15/2022, SpO2 100 %. General appearance: alert, cooperative, appears stated age, and no distress Lungs: clear to auscultation bilaterally Heart: regular rate and rhythm Abdomen: soft, non-tender; bowel sounds normal Pelvic: performed by another provider Extremities: no sign of DVT Presentation: cephalic Fetal monitoringBaseline: 130 bpm, Variability: Good {> 6 bpm), Accelerations: Reactive, and Decelerations: Absent Uterine activityFrequency: Every 2-3 minutes Dilation: 6 Effacement (%):  100 Station: -1 Exam by:: Audrey Sanders, CNM   Prenatal labs: ABO, Rh: O/Positive/-- (09/13 1431) Antibody: Negative (09/13 1431) Rubella: <0.90 (09/13 1431) RPR: Non Reactive (01/10 0952)  HBsAg: Negative (09/13 1431)  HIV: Non Reactive (01/10 0952)  GBS: Negative/-- (02/29 0817)  1 hr Glucola abnormal Genetic screening: NIPS low risk. AFP neg. Horizon neg.  Anatomy US normal  Prenatal Transfer Tool  Maternal Diabetes: Yes:  Diabetes Type:  Diet controlled Genetic Screening: Normal Maternal Ultrasounds/Referrals: Normal Fetal Ultrasounds or other Referrals:  None Maternal Substance Abuse:  No Significant Maternal Medications:  None Significant Maternal Lab Results:  Group B Strep negative Number of Prenatal Visits:greater than 3 verified prenatal visits Other Comments:  None  No results found for this or any previous visit (from the past 24 hour(s)).  Patient Active Problem List   Diagnosis Date Noted   Gestational diabetes mellitus (GDM) affecting pregnancy, antepartum 12/08/2022   Rubella non-immune status, antepartum 08/10/2022   Supervision of high risk pregnancy, antepartum 08/09/2022   VSD (ventricular septal defect), muscular 07/12/2016    Assessment/Plan:  Audrey Sanders is a 22 y.o. G1P0000 at [redacted]w[redacted]d here for SOL.  #Labor:progressing spontaneously. Expectant management  #Pain: Per request #FWB: Cat 1 #ID:  GBS neg #MOF: breast and formula #MOC:undecided #Circ:  Yes, plans inpatient  A1GDM - CBG q4h  Audrey Dice, MD  02/12/2023, 8:52 PM  I personally saw and evaluated the patient, performing the key elements of the service. I developed and verified the management plan that is described in the resident's/student's note, and I agree with the content with my edits above. VSS, HRR&R, Resp unlabored, Legs neg.  Audrey Sanders, CNM 02/12/2023 9:42 PM

## 2023-02-12 NOTE — Telephone Encounter (Signed)
Preadmission screen  

## 2023-02-12 NOTE — MAU Note (Signed)
Audrey Sanders is a 22 y.o. at [redacted]w[redacted]d here in MAU reporting: been having pains and pressure since 1400,  now every 5-6 min.  No bleeding or LOF.  Reports +FM. No recent vag exams.   Onset of complaint: 1400 Pain score: moderate Vitals:   02/12/23 1756  BP: 124/78  Pulse: (!) 107  Resp: 17  Temp: 99 F (37.2 C)  SpO2: 100%     FHT:126, ?variable with ctx Lab orders placed from triage:

## 2023-02-13 ENCOUNTER — Encounter (HOSPITAL_COMMUNITY): Payer: Self-pay | Admitting: Obstetrics and Gynecology

## 2023-02-13 ENCOUNTER — Encounter: Payer: Medicaid Other | Admitting: Family Medicine

## 2023-02-13 DIAGNOSIS — Z3A39 39 weeks gestation of pregnancy: Secondary | ICD-10-CM | POA: Diagnosis not present

## 2023-02-13 DIAGNOSIS — O24420 Gestational diabetes mellitus in childbirth, diet controlled: Secondary | ICD-10-CM | POA: Diagnosis not present

## 2023-02-13 LAB — RPR: RPR Ser Ql: NONREACTIVE

## 2023-02-13 LAB — CBC
HCT: 30.5 % — ABNORMAL LOW (ref 36.0–46.0)
Hemoglobin: 10.4 g/dL — ABNORMAL LOW (ref 12.0–15.0)
MCH: 30.6 pg (ref 26.0–34.0)
MCHC: 34.1 g/dL (ref 30.0–36.0)
MCV: 89.7 fL (ref 80.0–100.0)
Platelets: 221 10*3/uL (ref 150–400)
RBC: 3.4 MIL/uL — ABNORMAL LOW (ref 3.87–5.11)
RDW: 12.5 % (ref 11.5–15.5)
WBC: 14.2 10*3/uL — ABNORMAL HIGH (ref 4.0–10.5)
nRBC: 0 % (ref 0.0–0.2)

## 2023-02-13 MED ORDER — SENNOSIDES-DOCUSATE SODIUM 8.6-50 MG PO TABS
2.0000 | ORAL_TABLET | Freq: Every day | ORAL | Status: DC
  Filled 2023-02-13: qty 2

## 2023-02-13 MED ORDER — DIPHENHYDRAMINE HCL 25 MG PO CAPS: 25.0000 mg | ORAL_CAPSULE | Freq: Four times a day (QID) | ORAL | Status: DC | PRN

## 2023-02-13 MED ORDER — TETANUS-DIPHTH-ACELL PERTUSSIS 5-2.5-18.5 LF-MCG/0.5 IM SUSY: 0.5000 mL | PREFILLED_SYRINGE | Freq: Once | INTRAMUSCULAR | Status: DC

## 2023-02-13 MED ORDER — WITCH HAZEL-GLYCERIN EX PADS: 1.0000 | MEDICATED_PAD | CUTANEOUS | Status: DC | PRN

## 2023-02-13 MED ORDER — SIMETHICONE 80 MG PO CHEW: 80.0000 mg | CHEWABLE_TABLET | ORAL | Status: DC | PRN

## 2023-02-13 MED ORDER — ZOLPIDEM TARTRATE 5 MG PO TABS: 5.0000 mg | ORAL_TABLET | Freq: Every evening | ORAL | Status: DC | PRN

## 2023-02-13 MED ORDER — ACETAMINOPHEN 325 MG PO TABS
650.0000 mg | ORAL_TABLET | ORAL | Status: DC | PRN
  Administered 2023-02-13 (×2): 650 mg via ORAL
  Filled 2023-02-13 (×2): qty 2

## 2023-02-13 MED ORDER — ONDANSETRON HCL 4 MG PO TABS: 4.0000 mg | ORAL_TABLET | ORAL | Status: DC | PRN

## 2023-02-13 MED ORDER — COCONUT OIL OIL: 1.0000 | TOPICAL_OIL | Status: DC | PRN

## 2023-02-13 MED ORDER — PRENATAL MULTIVITAMIN CH
1.0000 | ORAL_TABLET | Freq: Every day | ORAL | Status: DC
  Administered 2023-02-13 – 2023-02-14 (×2): 1 via ORAL
  Filled 2023-02-13 (×2): qty 1

## 2023-02-13 MED ORDER — BENZOCAINE-MENTHOL 20-0.5 % EX AERO
1.0000 | INHALATION_SPRAY | CUTANEOUS | Status: DC | PRN
  Administered 2023-02-13: 1 via TOPICAL
  Filled 2023-02-13: qty 56

## 2023-02-13 MED ORDER — IBUPROFEN 600 MG PO TABS
600.0000 mg | ORAL_TABLET | Freq: Four times a day (QID) | ORAL | Status: DC
  Administered 2023-02-13 – 2023-02-14 (×6): 600 mg via ORAL
  Filled 2023-02-13 (×6): qty 1

## 2023-02-13 MED ORDER — DIBUCAINE (PERIANAL) 1 % EX OINT: 1.0000 | TOPICAL_OINTMENT | CUTANEOUS | Status: DC | PRN

## 2023-02-13 MED ORDER — ONDANSETRON HCL 4 MG/2ML IJ SOLN: 4.0000 mg | INTRAMUSCULAR | Status: DC | PRN

## 2023-02-14 MED ORDER — NORETHINDRONE 0.35 MG PO TABS: 1.0000 | ORAL_TABLET | Freq: Every day | ORAL | 11 refills | Status: DC

## 2023-02-14 MED ORDER — MEASLES, MUMPS & RUBELLA VAC IJ SOLR: 0.5000 mL | Freq: Once | INTRAMUSCULAR | Status: DC

## 2023-02-14 MED ORDER — IBUPROFEN 600 MG PO TABS: 600.0000 mg | ORAL_TABLET | Freq: Three times a day (TID) | ORAL | 0 refills | Status: DC | PRN

## 2023-02-14 NOTE — Discharge Instructions (Signed)
-   Continue your prenatal vitamins especially if breastfeeding - Try to eat iron rich food. - Take over the counter tylenol (500mg ) or ibuprofen (200mg ) three times a day as needed for cramping/pain. - follow up in clinic in 4-6 weeks as scheduled for your regular post partum visit. You will have a repeat blood sugar test around 6 weeks post partum. - Please come back to MAU if you notice persistently elevated blood pressures or you start to have a headache, that doesn't get better with medications (tylenol and ibuprofen), rest (4hrs of sleep) and drinking water.

## 2023-02-14 NOTE — Progress Notes (Addendum)
POSTPARTUM PROGRESS NOTE  Post Partum Day 1  Subjective:  Audrey Sanders is a 22 y.o. G1P1001 s/p VD at [redacted]w[redacted]d.  She reports she is doing well. No acute events overnight. She denies any problems with ambulating, voiding or po intake. Denies nausea or vomiting.  Pain is well controlled.  Lochia is minimal.  Objective: Blood pressure 105/62, pulse 74, temperature 98.4 F (36.9 C), temperature source Oral, resp. rate 16, height 5\' 1"  (1.549 m), weight 55.1 kg, last menstrual period 05/15/2022, SpO2 100 %, unknown if currently breastfeeding.  Physical Exam:  General: alert, cooperative and no distress Chest: no respiratory distress Heart:regular rate, distal pulses intact Abdomen: soft, nontender,  Uterine Fundus: firm, appropriately tender DVT Evaluation: No calf swelling or tenderness Extremities: no edema Skin: warm, dry  Recent Labs    02/12/23 2052 02/13/23 0500  HGB 11.7* 10.4*  HCT 36.1 30.5*    Assessment/Plan: Audrey Sanders is a 22 y.o. G1P1001 s/p SVD at [redacted]w[redacted]d   PPD#1 - Doing well  Routine postpartum care Contraception: POPs A1 GDM: BS in the 76s Feeding: bottle Dispo: Plan for discharge tomorrow.   LOS: 2 days   Liliane Channel MD MPH OB Fellow, [place] for [place] 02/14/2023

## 2023-02-19 ENCOUNTER — Other Ambulatory Visit: Payer: Self-pay

## 2023-02-19 ENCOUNTER — Encounter: Payer: Medicaid Other | Admitting: Family Medicine

## 2023-02-19 ENCOUNTER — Inpatient Hospital Stay (HOSPITAL_COMMUNITY): Admission: RE | Admit: 2023-02-19 | Payer: Medicaid Other | Source: Home / Self Care | Admitting: Family Medicine

## 2023-02-19 ENCOUNTER — Inpatient Hospital Stay (HOSPITAL_COMMUNITY): Payer: Medicaid Other

## 2023-02-20 ENCOUNTER — Encounter: Payer: Medicaid Other | Admitting: Family Medicine

## 2023-02-26 DIAGNOSIS — Z419 Encounter for procedure for purposes other than remedying health state, unspecified: Secondary | ICD-10-CM | POA: Diagnosis not present

## 2023-02-27 ENCOUNTER — Telehealth (HOSPITAL_COMMUNITY): Payer: Self-pay | Admitting: *Deleted

## 2023-02-27 NOTE — Telephone Encounter (Signed)
Call cannot be completed.  Odis Hollingshead, RN 02-27-2023 at 3;07pm

## 2023-03-09 ENCOUNTER — Ambulatory Visit (INDEPENDENT_AMBULATORY_CARE_PROVIDER_SITE_OTHER): Payer: Medicaid Other | Admitting: Cardiology

## 2023-03-09 ENCOUNTER — Encounter: Payer: Self-pay | Admitting: Cardiology

## 2023-03-09 VITALS — BP 111/80 | HR 80 | Ht 61.0 in | Wt 108.0 lb

## 2023-03-09 DIAGNOSIS — Q210 Ventricular septal defect: Secondary | ICD-10-CM | POA: Diagnosis not present

## 2023-03-09 NOTE — Progress Notes (Signed)
Cardio-Obstetrics Clinic  New Evaluation  Date:  03/09/2023   ID:  Audrey Sanders, DOB [birth year], MRN [id number]  PCP:  Oneita Hurt, No   [facility] Providers Cardiologist:  None  Electrophysiologist:  None       Referring MD: No ref. provider found   Chief Complaint: " I was asked to see you because I have VSD"  History of Present Illness:    Audrey Sanders is a 22 y.o. female [G1P1001] who is being seen today for the evaluation of Muscular vsd at the request of No ref. provider found.   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   She presented for a visit on 12/22/2022 at that time she was pregnant- an echo was done showing the same size VSD. No other complaints today - she is postpartum.     Prior CV Studies Reviewed: The following studies were reviewed today:  TTE 12/27/2022 IMPRESSIONS     1. Muscular VSD in 2 o'clock position on short axis, mid to distal septum  4 chamber view. Normal RV size. Marland Kitchen Left ventricular ejection fraction, by  estimation, is 55 to 60%. Left ventricular ejection fraction by 3D volume  is 61 %. The left ventricle has  normal function. The left ventricle has no regional wall motion  abnormalities. Left ventricular diastolic parameters were normal. The  average left ventricular global longitudinal strain is -23.3 %. The global  longitudinal strain is normal.   2. Right ventricular systolic function is normal. The right ventricular  size is normal. There is normal pulmonary artery systolic pressure. The  estimated right ventricular systolic pressure is 18.4 mmHg.   3. The mitral valve is normal in structure. Trivial mitral valve  regurgitation. No evidence of mitral stenosis.   4. The aortic valve is tricuspid. Aortic valve regurgitation is not  visualized. No aortic stenosis is present.   5. The inferior vena cava is normal in size with greater than 50%  respiratory variability,  suggesting right atrial pressure of 3 mmHg.   FINDINGS   Left Ventricle: Muscular VSD in 2 o'clock position on short axis, mid to  distal septum 4 chamber view. Normal RV size. Left ventricular ejection  fraction, by estimation, is 55 to 60%. Left ventricular ejection fraction  by 3D volume is 61 %. The left  ventricle has normal function. The left ventricle has no regional wall  motion abnormalities. The average left ventricular global longitudinal  strain is -23.3 %. The global longitudinal strain is normal. The left  ventricular internal cavity size was  normal in size. There is no left ventricular hypertrophy. Left ventricular  diastolic parameters were normal.   Right Ventricle: The right ventricular size is normal. No increase in  right ventricular wall thickness. Right ventricular systolic function is  normal. There is normal pulmonary artery systolic pressure. The tricuspid  regurgitant velocity is 1.96 m/s, and   with an assumed right atrial pressure of 3 mmHg, the estimated right  ventricular systolic pressure is 18.4 mmHg.   Left Atrium: Left atrial size was normal in size.   Right Atrium: Right atrial size was normal in size.   Pericardium: There is no evidence of pericardial effusion.   Mitral Valve: The mitral valve is normal in structure. Trivial mitral  valve regurgitation. No evidence of mitral valve stenosis.   Tricuspid Valve: The tricuspid valve is normal in structure. Tricuspid  valve regurgitation is mild . No evidence of tricuspid  stenosis.   Aortic Valve: The aortic valve is tricuspid. Aortic valve regurgitation is  not visualized. No aortic stenosis is present.   Pulmonic Valve: The pulmonic valve was normal in structure. Pulmonic valve  regurgitation is mild. No evidence of pulmonic stenosis.   Aorta: The aortic root is normal in size and structure.   Venous: The inferior vena cava is normal in size with greater than 50%  respiratory variability,  suggesting right atrial pressure of 3 mmHg.   IAS/Shunts: No atrial level shunt detected by color flow Doppler.   TTE 2017 Veins  Normal systemic venous drainage. Three pulmonary veins seen returning to the  left atrium. Normal pulmonary vein velocity.   Atrium  Normal right atrial size. Normal left atrial size. No evidence of atrial  septal defect.   Atrioventricular Valves  Normal mitral valve. No mitral valve insufficiency. Normal mitral valve  doppler inflow pattern. Normal tricuspid valve. Trivial tricuspid  insufficiency (physiologic). Right ventricular pressure estimate based on  tricuspid regurgitant jet equals 20 mmHg, plus right atrial pressure.   Left Ventricle  Normal left ventricle structure. Normal left ventricular dimension. Normal  left ventricular systolic function. Normal left ventricular diastolic  function.   Right Ventricle  Normal right ventricle structure. Normal right ventricular dimension. Normal  right ventricular systolic function.  Ventricular Septum  Normal septal curvature. Multiple muscular ventricular septal defects.  Aortic Valve  Normal trileaftlet aortic valve. No aortic valve insufficiency. No aortic  valve stenosis.  Pulmonary Valve  Normal pulmonic valve. No pulmonary valve stenosis. Trivial pulmonary  insufficiency (physiologic).  Conotruncus  Normal conotruncus.  Aorta   Left aortic arch with normal branching pattern. Normal aortic root size.  Unobstructed aortic arch. Normal pulsatile flow in descending aorta.  Ascending aortic velocity normal. Descending aortic velocity normal.  Pulmonary Artery  Normal pulmonary artery branches. No right pulmonary artery stenosis. No left  pulmonary artery stenosis.  Ductus Arteriosus  No patent ductus arteriosus.   Coronary Arteries  Normal coronary artery size and origins.  Fluid  No pericardial effusion.   No past medical history on file.  No past surgical history on file.    OB  History     Gravida  1   Para  1   Term  1   Preterm  0   AB  0   Living  1      SAB  0   IAB  0   Ectopic  0   Multiple  0   Live Births  1               Current Medications: No outpatient medications have been marked as taking for the 03/09/23 encounter (Office Visit) with Thomasene Ripple, DO.     Allergies:   Patient has no known allergies.   Social History   Socioeconomic History   Marital status: Single    Spouse name: Not on file   Number of children: Not on file   Years of education: Not on file   Highest education level: GED or equivalent  Occupational History   Not on file  Tobacco Use   Smoking status: Never    Passive exposure: Never   Smokeless tobacco: Never  Vaping Use   Vaping Use: Never used  Substance and Sexual Activity   Alcohol use: Never   Drug use: Never   Sexual activity: Not Currently    Birth control/protection: None  Other Topics Concern   Not on file  Social  History Narrative   Not on file   Social Determinants of Health   Financial Resource Strain: Patient Declined (12/06/2022)   Overall Financial Resource Strain (CARDIA)    Difficulty of Paying Living Expenses: Patient declined  Food Insecurity: No Food Insecurity (12/06/2022)   Hunger Vital Sign    Worried About Running Out of Food in the Last Year: Never true    Ran Out of Food in the Last Year: Never true  Transportation Needs: No Transportation Needs (12/06/2022)   PRAPARE - Administrator, Civil Service (Medical): No    Lack of Transportation (Non-Medical): No  Physical Activity: Unknown (12/06/2022)   Exercise Vital Sign    Days of Exercise per Week: 0 days    Minutes of Exercise per Session: Not on file  Stress: No Stress Concern Present (12/06/2022)   [employer] of Occupational Health - Occupational Stress Questionnaire    Feeling of Stress : Only a little  Social Connections: Moderately Isolated (12/06/2022)   Social Connection and  Isolation Panel [NHANES]    Frequency of Communication with Friends and Family: More than three times a week    Frequency of Social Gatherings with Friends and Family: Once a week    Attends Religious Services: Never    Database administrator or Organizations: No    Attends Engineer, structural: Not on file    Marital Status: Living with partner      No family history on file.    ROS:   Please see the history of present illness.     All other systems reviewed and are negative.   Labs/EKG Reviewed:    EKG:   EKG was ordered today.  Sinus rhythm HR   Recent Labs: 02/13/2023: Hemoglobin 10.4; Platelets 221   Recent Lipid Panel No results found for: "CHOL", "TRIG", "HDL", "CHOLHDL", "LDLCALC", "LDLDIRECT"  Physical Exam:    VS:  BP 111/80 (BP Location: Left Arm, Patient Position: Sitting, Cuff Size: Normal)   Pulse 80   Ht  (1.549 m)   Wt 108 lb (49 kg)   LMP 05/15/2022   SpO2 100%   BMI 20.41 kg/m     Wt Readings from Last 3 Encounters:  03/09/23 108 lb (49 kg) (13 %, Z= -1.14)*  02/12/23 121 lb 6.4 oz (55.1 kg) (39 %, Z= -0.28)*  02/07/23 122 lb 3.2 oz (55.4 kg) (41 %, Z= -0.24)*   * Growth percentiles are based on CDC (Girls, 2-20 Years) data.     GEN:  Well nourished, well developed in no acute distress HEENT: Normal NECK: No JVD; No carotid bruits LYMPHATICS: No lymphadenopathy CARDIAC: RRR, no murmurs, rubs, gallops RESPIRATORY:  Clear to auscultation without rales, wheezing or rhonchi  ABDOMEN: Soft, non-tender, non-distended MUSCULOSKELETAL:  No edema; No deformity  SKIN: Warm and dry NEUROLOGIC:  Alert and oriented x 3 PSYCHIATRIC:  Normal affect    Risk Assessment/Risk Calculators:            { Click for CHADS2VASc Score - THEN Refresh Note    :[id number]}      ASSESSMENT & PLAN:     Muscular Ventricular septal defect   She is doing well postpartum - no compliants.  She will follow up in 2 years.  Educated on warning  symptoms.      There are no Patient Instructions on file for this visit.   Dispo:  No follow-ups on file.   Medication Adjustments/Labs and Tests Ordered: Current medicines  are reviewed at length with the patient today.  Concerns regarding medicines are outlined above.  Tests Ordered: No orders of the defined types were placed in this encounter.  Medication Changes: No orders of the defined types were placed in this encounter.

## 2023-03-09 NOTE — Patient Instructions (Signed)
Medication Instructions:  Your physician recommends that you continue on your current medications as directed. Please refer to the Current Medication list given to you today.  *If you need a refill on your cardiac medications before your next appointment, please call your pharmacy*   Lab Work: None   Testing/Procedures: None   Follow-Up: At [facility], you and your health needs are our priority.  As part of our continuing mission to provide you with exceptional heart care, we have created designated Provider Care Teams.  These Care Teams include your primary Cardiologist (physician) and Advanced Practice Providers (APPs -  Physician Assistants and Nurse Practitioners) who all work together to provide you with the care you need, when you need it.  Your next appointment:   2 year(s)  Provider:   Kardie Tobb, DO    

## 2023-03-13 ENCOUNTER — Ambulatory Visit: Payer: Medicaid Other | Admitting: Family Medicine

## 2023-03-13 ENCOUNTER — Other Ambulatory Visit: Payer: Medicaid Other

## 2023-03-19 ENCOUNTER — Ambulatory Visit: Payer: Medicaid Other | Admitting: Family Medicine

## 2023-03-19 ENCOUNTER — Encounter: Payer: Self-pay | Admitting: Family Medicine

## 2023-03-19 NOTE — Progress Notes (Deleted)
    Post Partum Visit Note  Audrey Sanders is a 22 y.o. G107P1001 female who presents for a postpartum visit. She is 4 weeks postpartum following a normal spontaneous vaginal delivery.  I have fully reviewed the prenatal and intrapartum course. The delivery was at 39.1 gestational weeks.  Anesthesia: epidural. Postpartum course has been ***. Baby is doing well***. Baby is feeding by both breast and bottle - {formula:72}. Bleeding {vag bleed:12292}. Bowel function is {normal:32111}. Bladder function is {normal:32111}. Patient {is/is not:9024} sexually active. Contraception method is oral progesterone-only contraceptive. Postpartum depression screening: {gen negative/positive:315881}.   The pregnancy intention screening data noted above was reviewed. Potential methods of contraception were discussed. The patient elected to proceed with No data recorded.    Health Maintenance Due  Topic Date Due   FOOT EXAM  Never done   OPHTHALMOLOGY EXAM  Never done   Diabetic kidney evaluation - eGFR measurement  Never done   Diabetic kidney evaluation - Urine ACR  Never done   HEMOGLOBIN A1C  02/07/2023    {Common ambulatory SmartLinks:19316}  Review of Systems {ros; complete:30496}  Objective:  LMP 05/15/2022    General:  {gen appearance:16600}   Breasts:  {desc; normal/abnormal/not indicated:14647}  Lungs: {lung exam:16931}  Heart:  {heart exam:5510}  Abdomen: {abdomen exam:16834}   Wound {Wound assessment:11097}  GU exam:  {desc; normal/abnormal/not indicated:14647}       Assessment:    There are no diagnoses linked to this encounter.  *** postpartum exam.   Plan:   Essential components of care per ACOG recommendations:  1.  Mood and well being: Patient with {gen negative/positive:315881} depression screening today. Reviewed local resources for support.  - Patient tobacco use? {tobacco use:25506}  - hx of drug use? {yes/no:25505}    2. Infant care and feeding:  -Patient  currently breastmilk feeding? {yes/no:25502}  -Social determinants of health (SDOH) reviewed in EPIC. No concerns***The following needs were identified***  3. Sexuality, contraception and birth spacing - Patient {DOES_DOES VWU:98119} want a pregnancy in the next year.  Desired family size is {NUMBER 1-10:22536} children.  - Reviewed reproductive life planning. Reviewed contraceptive methods based on pt preferences and effectiveness.  Patient desired {Upstream End Methods:24109} today.   - Discussed birth spacing of 18 months  4. Sleep and fatigue -Encouraged family/partner/community support of 4 hrs of uninterrupted sleep to help with mood and fatigue  5. Physical Recovery  - Discussed patients delivery and complications. She describes her labor as {description:25511} - Patient had a {CHL AMB DELIVERY:[phone]}. Patient had a {laceration:25518} laceration. Perineal healing reviewed. Patient expressed understanding - Patient has urinary incontinence? {yes/no:25515} - Patient {ACTION; IS/IS JYN:[id number]} safe to resume physical and sexual activity  6.  Health Maintenance - HM due items addressed {Yes or If no, why not?:20788} - Last pap smear No results found for: "DIAGPAP" Pap smear {done:10129} at today's visit.  -Breast Cancer screening indicated? {indicated:25516}  7. Chronic Disease/Pregnancy Condition follow up: {Follow up:25499}   Center for [employer], [facility]

## 2023-03-20 NOTE — Progress Notes (Unsigned)
Post Partum Visit Note  Audrey Sanders is a 22 y.o. G41P1001 female who presents for a postpartum visit. She is 4 weeks postpartum following a normal spontaneous vaginal delivery.  I have fully reviewed the prenatal and intrapartum course. The delivery was at 39.0 gestational weeks.  Anesthesia: epidural. Postpartum course has been nomral. Baby is doing well. Baby is feeding by bottle - Similac Advance. Bleeding thin lochia. Bowel function is normal. Bladder function is normal. Patient is not sexually active. Contraception method is oral progesterone-only contraceptive. Postpartum depression screening: negative.   The pregnancy intention screening data noted above was reviewed. Potential methods of contraception were discussed. The patient elected to proceed with No data recorded.   Edinburgh Postnatal Depression Scale - 03/22/23 1621       Edinburgh Postnatal Depression Scale:  In the Past 7 Days   I have been able to laugh and see the funny side of things. 0    I have looked forward with enjoyment to things. 0    I have blamed myself unnecessarily when things went wrong. 0    I have been anxious or worried for no good reason. 0    I have felt scared or panicky for no good reason. 0    Things have been getting on top of me. 0    I have been so unhappy that I have had difficulty sleeping. 1    I have felt sad or miserable. 0    I have been so unhappy that I have been crying. 1    The thought of harming myself has occurred to me. 0    Edinburgh Postnatal Depression Scale Total 2             Health Maintenance Due  Topic Date Due   FOOT EXAM  Never done   OPHTHALMOLOGY EXAM  Never done   Diabetic kidney evaluation - eGFR measurement  Never done   Diabetic kidney evaluation - Urine ACR  Never done   HEMOGLOBIN A1C  02/07/2023    The following portions of the patient's history were reviewed and updated as appropriate: allergies, current medications, past family history, past  medical history, past social history, past surgical history, and problem list.  Review of Systems Pertinent items noted in HPI and remainder of comprehensive ROS otherwise negative.  Objective:  BP 112/78   Pulse (!) 108   Ht  (1.549 m)   Wt 105 lb 3.2 oz (47.7 kg)   LMP 03/20/2023 (Exact Date)   Breastfeeding No   BMI 19.88 kg/m    General:  alert, cooperative, and appears stated age  Lungs: Normal effort  Heart:  regular rate and rhythm  Abdomen: soft, non-tender; bowel sounds normal; no masses,  no organomegaly        Assessment:    Normal postpartum exam.   Plan:   Essential components of care per ACOG recommendations:  1.  Mood and well being: Patient with negative depression screening today. Reviewed local resources for support.  - Patient tobacco use? Yes. Patient desires to quit? No.   - hx of drug use? No.    2. Infant care and feeding:  -Patient currently breastmilk feeding? No.  -Social determinants of health (SDOH) reviewed in EPIC.   3. Sexuality, contraception and birth spacing - Patient does not want a pregnancy in the next year.  Desired family size is unsure children.  - Reviewed reproductive life planning. Reviewed contraceptive methods based on pt  preferences and effectiveness.  Patient desired Oral Contraceptive today.   - Discussed birth spacing of 18 months  4. Sleep and fatigue -Encouraged family/partner/community support of 4 hrs of uninterrupted sleep to help with mood and fatigue  5. Physical Recovery  - Discussed patients delivery and complications. She describes her labor as good. - Patient had a Vaginal, no problems at delivery. Patient had a no laceration. Perineal healing reviewed. Patient expressed understanding - Patient has urinary incontinence? No. - Patient is safe to resume physical and sexual activity  6.  Health Maintenance - HM due items addressed Yes - Last pap smear No results found for: "DIAGPAP" Pap smear not done  at today's visit.  -Breast Cancer screening indicated? No.   7. Chronic Disease/Pregnancy Condition follow up: Gestational Diabetes needs 2 hour scheduled  Center for [employer], [facility]

## 2023-03-22 ENCOUNTER — Encounter: Payer: Self-pay | Admitting: Family Medicine

## 2023-03-22 ENCOUNTER — Other Ambulatory Visit: Payer: Self-pay

## 2023-03-22 ENCOUNTER — Ambulatory Visit (INDEPENDENT_AMBULATORY_CARE_PROVIDER_SITE_OTHER): Payer: Medicaid Other | Admitting: Family Medicine

## 2023-03-22 DIAGNOSIS — Z8632 Personal history of gestational diabetes: Secondary | ICD-10-CM | POA: Diagnosis not present

## 2023-03-28 DIAGNOSIS — Z419 Encounter for procedure for purposes other than remedying health state, unspecified: Secondary | ICD-10-CM | POA: Diagnosis not present

## 2023-03-29 ENCOUNTER — Other Ambulatory Visit: Payer: Self-pay

## 2023-03-29 DIAGNOSIS — O24429 Gestational diabetes mellitus in childbirth, unspecified control: Secondary | ICD-10-CM

## 2023-03-30 ENCOUNTER — Other Ambulatory Visit: Payer: Medicaid Other

## 2023-03-30 ENCOUNTER — Encounter: Payer: Self-pay | Admitting: Family Medicine

## 2023-03-30 ENCOUNTER — Other Ambulatory Visit: Payer: Self-pay

## 2023-03-30 DIAGNOSIS — O24429 Gestational diabetes mellitus in childbirth, unspecified control: Secondary | ICD-10-CM | POA: Diagnosis not present

## 2023-03-31 LAB — GLUCOSE TOLERANCE, 2 HOURS
Glucose, 2 hour: 100 mg/dL (ref 70–139)
Glucose, GTT - Fasting: 95 mg/dL (ref 70–99)

## 2023-04-16 ENCOUNTER — Encounter: Payer: Self-pay | Admitting: Cardiology

## 2023-04-28 DIAGNOSIS — Z419 Encounter for procedure for purposes other than remedying health state, unspecified: Secondary | ICD-10-CM | POA: Diagnosis not present

## 2023-05-28 DIAGNOSIS — Z419 Encounter for procedure for purposes other than remedying health state, unspecified: Secondary | ICD-10-CM | POA: Diagnosis not present

## 2023-06-28 DIAGNOSIS — Z419 Encounter for procedure for purposes other than remedying health state, unspecified: Secondary | ICD-10-CM | POA: Diagnosis not present

## 2023-07-02 ENCOUNTER — Other Ambulatory Visit: Payer: Self-pay | Admitting: Family Medicine

## 2023-07-02 DIAGNOSIS — Z3480 Encounter for supervision of other normal pregnancy, unspecified trimester: Secondary | ICD-10-CM

## 2023-07-08 ENCOUNTER — Emergency Department (HOSPITAL_BASED_OUTPATIENT_CLINIC_OR_DEPARTMENT_OTHER)
Admission: EM | Admit: 2023-07-08 | Discharge: 2023-07-08 | Disposition: A | Discharge status: Home / Self Care | Payer: Medicaid Other | Attending: Emergency Medicine | Admitting: Emergency Medicine

## 2023-07-08 ENCOUNTER — Emergency Department (HOSPITAL_BASED_OUTPATIENT_CLINIC_OR_DEPARTMENT_OTHER): Payer: Medicaid Other

## 2023-07-08 ENCOUNTER — Encounter (HOSPITAL_BASED_OUTPATIENT_CLINIC_OR_DEPARTMENT_OTHER): Payer: Self-pay | Admitting: Emergency Medicine

## 2023-07-08 ENCOUNTER — Encounter: Payer: Self-pay | Admitting: Emergency Medicine

## 2023-07-08 ENCOUNTER — Other Ambulatory Visit: Payer: Self-pay

## 2023-07-08 ENCOUNTER — Ambulatory Visit
Admission: EM | Admit: 2023-07-08 | Discharge: 2023-07-08 | Disposition: A | Discharge status: Home / Self Care | Payer: Medicaid Other | Attending: Physician Assistant | Admitting: Physician Assistant

## 2023-07-08 DIAGNOSIS — R7401 Elevation of levels of liver transaminase levels: Secondary | ICD-10-CM

## 2023-07-08 DIAGNOSIS — R112 Nausea with vomiting, unspecified: Secondary | ICD-10-CM

## 2023-07-08 DIAGNOSIS — R1013 Epigastric pain: Secondary | ICD-10-CM | POA: Diagnosis not present

## 2023-07-08 DIAGNOSIS — R109 Unspecified abdominal pain: Secondary | ICD-10-CM | POA: Diagnosis not present

## 2023-07-08 DIAGNOSIS — K8020 Calculus of gallbladder without cholecystitis without obstruction: Secondary | ICD-10-CM | POA: Diagnosis not present

## 2023-07-08 DIAGNOSIS — R1011 Right upper quadrant pain: Secondary | ICD-10-CM | POA: Diagnosis present

## 2023-07-08 LAB — URINALYSIS, ROUTINE W REFLEX MICROSCOPIC
Bacteria, UA: NONE SEEN
Bilirubin Urine: NEGATIVE
Glucose, UA: NEGATIVE mg/dL
Ketones, ur: NEGATIVE mg/dL
Leukocytes,Ua: NEGATIVE
Nitrite: NEGATIVE
Protein, ur: NEGATIVE mg/dL
Specific Gravity, Urine: 1.017 (ref 1.005–1.030)
pH: 7 (ref 5.0–8.0)

## 2023-07-08 LAB — POCT URINALYSIS DIP (MANUAL ENTRY)
Bilirubin, UA: NEGATIVE
Blood, UA: NEGATIVE
Glucose, UA: NEGATIVE mg/dL
Ketones, POC UA: NEGATIVE mg/dL
Leukocytes, UA: NEGATIVE
Nitrite, UA: NEGATIVE
Protein Ur, POC: NEGATIVE mg/dL
Spec Grav, UA: 1.025 (ref 1.010–1.025)
Urobilinogen, UA: 0.2 E.U./dL
pH, UA: 7.5 (ref 5.0–8.0)

## 2023-07-08 LAB — CBC
HCT: 38.1 % (ref 36.0–46.0)
Hemoglobin: 12.7 g/dL (ref 12.0–15.0)
MCH: 30 pg (ref 26.0–34.0)
MCHC: 33.3 g/dL (ref 30.0–36.0)
MCV: 89.9 fL (ref 80.0–100.0)
Platelets: 256 10*3/uL (ref 150–400)
RBC: 4.24 MIL/uL (ref 3.87–5.11)
RDW: 13.3 % (ref 11.5–15.5)
WBC: 8.3 10*3/uL (ref 4.0–10.5)
nRBC: 0 % (ref 0.0–0.2)

## 2023-07-08 LAB — COMPREHENSIVE METABOLIC PANEL
ALT: 49 U/L — ABNORMAL HIGH (ref 0–44)
AST: 115 U/L — ABNORMAL HIGH (ref 15–41)
Albumin: 4.7 g/dL (ref 3.5–5.0)
Alkaline Phosphatase: 77 U/L (ref 38–126)
Anion gap: 10 (ref 5–15)
BUN: 8 mg/dL (ref 6–20)
CO2: 25 mmol/L (ref 22–32)
Calcium: 9.9 mg/dL (ref 8.9–10.3)
Chloride: 103 mmol/L (ref 98–111)
Creatinine, Ser: 0.59 mg/dL (ref 0.44–1.00)
GFR, Estimated: >60 mL/min (ref >60)
Glucose, Bld: 100 mg/dL — ABNORMAL HIGH (ref 70–99)
Potassium: 3.7 mmol/L (ref 3.5–5.1)
Sodium: 138 mmol/L (ref 135–145)
Total Bilirubin: 1 mg/dL (ref 0.3–1.2)
Total Protein: 7.8 g/dL (ref 6.5–8.1)

## 2023-07-08 LAB — POCT URINE PREGNANCY: Preg Test, Ur: NEGATIVE

## 2023-07-08 LAB — LIPASE, BLOOD: Lipase: <10 U/L — ABNORMAL LOW (ref 11–51)

## 2023-07-08 LAB — HCG, SERUM, QUALITATIVE: Preg, Serum: NEGATIVE

## 2023-07-08 MED ORDER — ONDANSETRON 4 MG PO TBDP: 4.0000 mg | ORAL_TABLET | Freq: Three times a day (TID) | ORAL | 0 refills | Status: DC | PRN

## 2023-07-08 MED ORDER — OXYCODONE HCL 5 MG PO TABS: 5.0000 mg | ORAL_TABLET | Freq: Four times a day (QID) | ORAL | 0 refills | Status: DC | PRN

## 2023-07-08 NOTE — ED Triage Notes (Signed)
Pt arrives to ED with c/o RUQ abdominal pain, nausea, and vomiting that started this morning.

## 2023-07-08 NOTE — ED Provider Notes (Signed)
[place] EMERGENCY DEPARTMENT AT [facility] ([facility]) Provider Note   CSN: [id number] Arrival date & time: 07/08/23  1534     History  Chief Complaint  Patient presents with   Abdominal Pain    Audrey Sanders is a 22 y.o. female.  Patient presents to the emergency department today for evaluation of abdominal pain.  Patient has had intermittent abdominal pains, worse in the right upper quadrant, over the past 2 months.  She had acute worsening of her symptoms this morning with associated nausea and vomiting.  She presented to a [facility] and was referred to the emergency department for evaluation.  Patient denies heavy NSAID or alcohol use.  No known history of gallstones.  Pain radiates to her back.  No lower abdominal pain.  No dysuria, hematuria, increased frequency or urgency.  No chest pain or shortness of breath.  She has not really noticed any foods which make the symptoms worse.  At urgent care pregnancy test was negative, dipstick was negative.  Patient denies recent infectious symptoms, URI symptoms.       Home Medications Prior to Admission medications   Medication Sig Start Date End Date Taking? Authorizing Provider  norethindrone (ORTHO MICRONOR) 0.35 MG tablet Take 1 tablet (0.35 mg total) by mouth daily. 02/14/23 02/14/24  Ndulue, Nadene Rubins, MD      Allergies    Patient has no known allergies.    Review of Systems   Review of Systems  Physical Exam Updated Vital Signs BP 116/70 (BP Location: Right Arm)   Pulse (!) 59   Temp 98.5 F (36.9 C)   Resp 18   Ht 5\' 1"  (1.549 m)   Wt 54.4 kg   LMP 06/24/2023 (Approximate)   SpO2 100%   BMI 22.67 kg/m   Physical Exam Vitals and nursing note reviewed.  Constitutional:      General: She is not in acute distress.    Appearance: She is well-developed.  HENT:     Head: Normocephalic and atraumatic.     Right Ear: External ear normal.     Left Ear: External ear normal.     Nose: Nose normal.   Eyes:     Conjunctiva/sclera: Conjunctivae normal.  Cardiovascular:     Rate and Rhythm: Normal rate and regular rhythm.     Heart sounds: No murmur heard. Pulmonary:     Effort: No respiratory distress.     Breath sounds: No wheezing, rhonchi or rales.  Abdominal:     Palpations: Abdomen is soft.     Tenderness: There is abdominal tenderness in the right upper quadrant. There is no guarding or rebound. Negative signs include Murphy's sign and McBurney's sign.  Musculoskeletal:     Cervical back: Normal range of motion and neck supple.     Right lower leg: No edema.     Left lower leg: No edema.  Skin:    General: Skin is warm and dry.     Findings: No rash.  Neurological:     General: No focal deficit present.     Mental Status: She is alert. Mental status is at baseline.     Motor: No weakness.  Psychiatric:        Mood and Affect: Mood normal.     ED Results / Procedures / Treatments   Labs (all labs ordered are listed, but only abnormal results are displayed) Labs Reviewed  LIPASE, BLOOD - Abnormal; Notable for the following components:  Result Value   Lipase <10 (*)    All other components within normal limits  COMPREHENSIVE METABOLIC PANEL - Abnormal; Notable for the following components:   Glucose, Bld 100 (*)    AST 115 (*)    ALT 49 (*)    All other components within normal limits  CBC  HCG, SERUM, QUALITATIVE  URINALYSIS, ROUTINE W REFLEX MICROSCOPIC    EKG None  Radiology US Abdomen Limited RUQ (LIVER/GB)  Result Date: 07/08/2023 CLINICAL DATA:  Two-month history of intermittent right upper quadrant abdominal pain, nausea, and vomiting EXAM: ULTRASOUND ABDOMEN LIMITED RIGHT UPPER QUADRANT COMPARISON:  None Available. FINDINGS: Gallbladder: Cholelithiasis measuring up to 8 mm. No thickening visualized. No sonographic Murphy sign noted by sonographer. Common bile duct: Diameter: 3 mm Liver: No focal lesion identified. Within normal limits in  parenchymal echogenicity. Portal vein is patent on color Doppler imaging with normal direction of blood flow towards the liver. Other: None. IMPRESSION: Cholelithiasis without sonographic evidence of acute cholecystitis. Electronically Signed   By: Agustin Cree M.D.   On: 07/08/2023 18:48    Procedures Procedures    Medications Ordered in ED Medications - No data to display  ED Course/ Medical Decision Making/ A&P    Patient seen and examined. History obtained directly from patient.  Reviewed urgent care notes.  Labs/EKG: Personally reviewed and interpreted including: CBC with normal white blood cell count and red blood cell count and is unremarkable; CMP with mildly elevated transaminases with AST of 115 and ALT of 49, normal alkaline phosphatase and bilirubin, normal electrolytes; lipase normal.  Imaging: Ordered right upper quadrant ultrasound  Medications/Fluids: None ordered  Most recent vital signs reviewed and are as follows: BP 116/70 (BP Location: Right Arm)   Pulse (!) 59   Temp 98.5 F (36.9 C)   Resp 18   Ht 5\' 1"  (1.549 m)   Wt 54.4 kg   LMP 06/24/2023 (Approximate)   SpO2 100%   BMI 22.67 kg/m   Initial impression: Right upper quadrant abdominal pain, recurrent  7:06 PM Reassessment performed. Patient appears improved.  She states that she continues to feel well.  Imaging personally visualized and interpreted including: Right upper quadrant ultrasound, agree gallstones noted, no signs of acute cholecystitis  Reviewed pertinent lab work and imaging with patient at bedside. Questions answered.   Most current vital signs reviewed and are as follows: BP 125/72   Pulse 70   Temp 98.5 F (36.9 C)   Resp 18   Ht 5\' 1"  (1.549 m)   Wt 54.4 kg   LMP 06/24/2023 (Approximate)   SpO2 100%   BMI 22.67 kg/m   Plan: Discharge to home.   Prescriptions written for: Oxycodone 5 mg # 6 tablets, Zofran  Other home care instructions discussed: Avoidance of greasy and  fatty foods or other foods which cause her symptoms to be worse.  ED return instructions discussed: The patient was urged to return to the Emergency Department immediately with worsening of current symptoms, worsening abdominal pain, persistent vomiting, blood noted in stools, fever, or any other concerns. The patient verbalized understanding.   Follow-up instructions discussed: Patient encouraged to follow-up with their PCP in 7 days for reevaluation of symptoms and recheck of liver function testing.  Encourage follow-up with general surgery to evaluate for symptomatic cholelithiasis.  Medical Decision Making Amount and/or Complexity of Data Reviewed Labs: ordered. Radiology: ordered.  Risk Prescription drug management.   For this patient's complaint of abdominal pain, the following conditions were considered on the differential diagnosis: gastritis/PUD, enteritis/duodenitis, appendicitis, cholelithiasis/cholecystitis, cholangitis, pancreatitis, ruptured viscus, colitis, diverticulitis, small/large bowel obstruction, proctitis, cystitis, pyelonephritis, ureteral colic, aortic dissection, aortic aneurysm. In women, ectopic pregnancy, pelvic inflammatory disease, ovarian cysts, and tubo-ovarian abscess were also considered. Atypical chest etiologies were also considered including ACS, PE, and pneumonia.  Patient with mild elevation in transaminases, however no signs of common bile duct dilation.  Suspect that her pain is most likely due to symptomatic cholelithiasis.  As her pain is currently improved, feel that she is appropriate for discharge to home.  Low concern for acute cholecystitis at this point.  She will require outpatient follow-up.  The patient's vital signs, pertinent lab work and imaging were reviewed and interpreted as discussed in the ED course. Hospitalization was considered for further testing, treatments, or serial exams/observation. However as  patient is well-appearing, has a stable exam, and reassuring studies today, I do not feel that they warrant admission at this time. This plan was discussed with the patient who verbalizes agreement and comfort with this plan and seems reliable and able to return to the Emergency Department with worsening or changing symptoms.           Final Clinical Impression(s) / ED Diagnoses Final diagnoses:  Calculus of gallbladder without cholecystitis without obstruction  Transaminitis    Rx / DC Orders ED Discharge Orders          Ordered    oxyCODONE (OXY IR/ROXICODONE) 5 MG immediate release tablet  Every 6 hours PRN        07/08/23 1905    ondansetron (ZOFRAN-ODT) 4 MG disintegrating tablet  Every 8 hours PRN        07/08/23 1905              Renne Crigler, PA-C 07/08/23 1909    Horton, Clabe Seal, DO 07/08/23 2113

## 2023-07-08 NOTE — ED Notes (Signed)
Patient is being discharged from the Urgent Care and sent to the Emergency Department via POV . Per Sherrie Sport, PA, patient is in need of higher level of care due to limited resources. Patient is aware and verbalizes understanding of plan of care.  Vitals:   07/08/23 1445  BP: 126/80  Pulse: 76  Resp: 16  Temp: 98.8 F (37.1 C)  SpO2: 99%

## 2023-07-08 NOTE — ED Triage Notes (Signed)
Epigastric pain through to back.  2 months ago had the first episode.  This episode started this morning.  Patient has vomited one time today.  Last BM was earlier today and was diarrhea

## 2023-07-08 NOTE — Discharge Instructions (Addendum)
Please read and follow all provided instructions.  Your diagnoses today include:  1. Calculus of gallbladder without cholecystitis without obstruction   2. Transaminitis     Tests performed today include: Blood cell counts and platelets Kidney and liver function tests: Your liver function tests were slightly elevated, please have this rechecked by your doctor in about a week Pancreas function test (called lipase) Urine test to look for infection A blood or urine test for pregnancy (women only) Ultrasound of your gallbladder: Shows gallstones but no signs of a gallbladder infection Vital signs. See below for your results today.   Medications prescribed:  Oxycodone - narcotic pain medication  DO NOT drive or perform any activities that require you to be awake and alert because this medicine can make you drowsy.   Zofran (ondansetron) - for nausea and vomiting  Take any prescribed medications only as directed.  Home care instructions:  Follow any educational materials contained in this packet.  Follow-up instructions: Please follow-up with your primary care provider in the next 7 days for further evaluation of your symptoms.  Please follow-up with general surgery referral in 1 week as well for evaluation of your gallstones.  Return instructions:  SEEK IMMEDIATE MEDICAL ATTENTION IF: The pain does not go away or becomes severe  A temperature above 101F develops  Repeated vomiting occurs (multiple episodes)  The pain becomes localized to portions of the abdomen. The right side could possibly be appendicitis. In an adult, the left lower portion of the abdomen could be colitis or diverticulitis.  Blood is being passed in stools or vomit (bright red or black tarry stools)  You develop chest pain, difficulty breathing, dizziness or fainting, or become confused, poorly responsive, or inconsolable (young children) If you have any other emergent concerns regarding your health  Additional  Information: Abdominal (belly) pain can be caused by many things. Your caregiver performed an examination and possibly ordered blood/urine tests and imaging (CT scan, x-rays, ultrasound). Many cases can be observed and treated at home after initial evaluation in the emergency department. Even though you are being discharged home, abdominal pain can be unpredictable. Therefore, you need a repeated exam if your pain does not resolve, returns, or worsens. Most patients with abdominal pain don't have to be admitted to the hospital or have surgery, but serious problems like appendicitis and gallbladder attacks can start out as nonspecific pain. Many abdominal conditions cannot be diagnosed in one visit, so follow-up evaluations are very important.  Your vital signs today were: BP (!) 114/90 (BP Location: Right Arm)   Pulse 67   Temp 98.5 F (36.9 C)   Resp 18   Ht 5\' 1"  (1.549 m)   Wt 54.4 kg   LMP 06/24/2023 (Approximate)   SpO2 99%   BMI 22.67 kg/m  If your blood pressure (bp) was elevated above 135/85 this visit, please have this repeated by your doctor within one month. --------------

## 2023-07-08 NOTE — ED Provider Notes (Signed)
[facility]    CSN: [id number] Arrival date & time: 07/08/23  1412      History   Chief Complaint Chief Complaint  Patient presents with   Abdominal Pain    HPI Audrey Sanders is a 22 y.o. female.   Patient here today for evaluation of epigastric pain that for started about 2 months ago.  She states that symptoms have been intermittent and occur about once every couple weeks.  She notes that she does have associated nausea and vomiting with pain.  She did have some diarrhea today as well.  She denies any fever.  She has not had any blood in her stool or dark tarry stools.  The history is provided by the patient.    History reviewed. No pertinent past medical history.  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 No pertinent surgical history.  OB History     Gravida  1   Para  1   Term  1   Preterm  0   AB  0   Living  1      SAB  0   IAB  0   Ectopic  0   Multiple  0   Live Births  1            Home Medications    Prior to Admission medications   Medication Sig Start Date End Date Taking? Authorizing Provider  norethindrone (ORTHO MICRONOR) 0.35 MG tablet Take 1 tablet (0.35 mg total) by mouth daily. 02/14/23 02/14/24  Alfredia Ferguson, MD    Family History History reviewed. No pertinent family history.  Social History Social History   Tobacco Use   Smoking status: Never    Passive exposure: Never   Smokeless tobacco: Never  Vaping Use   Vaping status: Never Used  Substance Use Topics   Alcohol use: Never   Drug use: Never     Allergies   Patient has no known allergies.   Review of Systems Review of Systems  Constitutional:  Negative for chills and fever.  Eyes:  Negative for discharge and redness.  Gastrointestinal:  Positive for abdominal pain, diarrhea, nausea and vomiting.  Negative for blood in stool.     Physical Exam Triage Vital Signs ED Triage Vitals  Encounter Vitals Group     BP      Systolic BP Percentile      Diastolic BP Percentile      Pulse      Resp      Temp      Temp src      SpO2      Weight      Height      Head Circumference      Peak Flow      Pain Score      Pain Loc      Pain Education      Exclude from Growth Chart    No data found.  Updated Vital Signs BP 126/80 (BP Location: Left Arm)   Pulse 76   Temp 98.8 F (37.1 C) (Oral)   Resp 16   LMP 06/24/2023 (Approximate)   SpO2 99%      Physical Exam Vitals and nursing note reviewed.  Constitutional:      General: She is not in acute distress.    Appearance: Normal appearance. She  is not ill-appearing.  HENT:     Head: Normocephalic and atraumatic.  Eyes:     Conjunctiva/sclera: Conjunctivae normal.  Cardiovascular:     Rate and Rhythm: Normal rate and regular rhythm.  Pulmonary:     Effort: Pulmonary effort is normal. No respiratory distress.     Breath sounds: Normal breath sounds. No wheezing, rhonchi or rales.  Abdominal:     General: Abdomen is flat. Bowel sounds are normal. There is no distension.     Palpations: Abdomen is soft.     Tenderness: There is abdominal tenderness (epigastric, RUQ, mild LUQ). There is no guarding.  Neurological:     Mental Status: She is alert.  Psychiatric:        Mood and Affect: Mood normal.        Behavior: Behavior normal.        Thought Content: Thought content normal.      UC Treatments / Results  Labs (all labs ordered are listed, but only abnormal results are displayed) Labs Reviewed  POCT URINALYSIS DIP (MANUAL ENTRY)  POCT URINE PREGNANCY    EKG   Radiology No results found.  Procedures Procedures (including critical care time)  Medications Ordered in UC Medications - No data to display  Initial Impression / Assessment and Plan / UC Course  I have reviewed the triage vital signs and the  nursing notes.  Pertinent labs & imaging results that were available during my care of the patient were reviewed by me and considered in my medical decision making (see chart for details).    Recommended further evaluation in the emergency room to rule out gallbladder etiology of symptoms given right upper quadrant pain and presentation.  Patient expresses understanding and will transport via POV.  Final Clinical Impressions(s) / UC Diagnoses   Final diagnoses:  Abdominal pain, epigastric  Nausea and vomiting, unspecified vomiting type   Discharge Instructions   None    ED Prescriptions   None    PDMP not reviewed this encounter.   Tomi Bamberger, PA-C 07/08/23 1502

## 2023-10-01 ENCOUNTER — Other Ambulatory Visit: Payer: Self-pay | Admitting: Family Medicine

## 2023-10-01 DIAGNOSIS — O24419 Gestational diabetes mellitus in pregnancy, unspecified control: Secondary | ICD-10-CM

## 2023-12-17 ENCOUNTER — Telehealth: Payer: Self-pay

## 2023-12-18 NOTE — Telephone Encounter (Signed)
Call received on nurse voicemail requesting assistance with birth control prescription. Called pt. Number not valid. MyChart message sent.

## 2024-02-29 ENCOUNTER — Encounter: Payer: Self-pay | Admitting: Family Medicine

## 2024-02-29 ENCOUNTER — Encounter

## 2024-03-03 ENCOUNTER — Encounter (HOSPITAL_COMMUNITY): Payer: Self-pay | Admitting: *Deleted

## 2024-03-03 ENCOUNTER — Ambulatory Visit (INDEPENDENT_AMBULATORY_CARE_PROVIDER_SITE_OTHER)
Admission: EM | Admit: 2024-03-03 | Discharge: 2024-03-03 | Disposition: A | Discharge status: Home / Self Care | Source: Home / Self Care

## 2024-03-03 DIAGNOSIS — K8020 Calculus of gallbladder without cholecystitis without obstruction: Secondary | ICD-10-CM | POA: Insufficient documentation

## 2024-03-03 HISTORY — DX: Calculus of gallbladder without cholecystitis without obstruction: K80.20

## 2024-03-03 LAB — CBC WITH DIFFERENTIAL/PLATELET
Abs Immature Granulocytes: 0.04 10*3/uL (ref 0.00–0.07)
Basophils Absolute: 0 10*3/uL (ref 0.0–0.1)
Basophils Relative: 1 %
Eosinophils Absolute: 0.1 10*3/uL (ref 0.0–0.5)
Eosinophils Relative: 1 %
HCT: 38.1 % (ref 36.0–46.0)
Hemoglobin: 12.8 g/dL (ref 12.0–15.0)
Immature Granulocytes: 1 %
Lymphocytes Relative: 32 %
Lymphs Abs: 2.2 10*3/uL (ref 0.7–4.0)
MCH: 30.6 pg (ref 26.0–34.0)
MCHC: 33.6 g/dL (ref 30.0–36.0)
MCV: 91.1 fL (ref 80.0–100.0)
Monocytes Absolute: 0.8 10*3/uL (ref 0.1–1.0)
Monocytes Relative: 12 %
Neutro Abs: 3.7 10*3/uL (ref 1.7–7.7)
Neutrophils Relative %: 53 %
Platelets: 245 10*3/uL (ref 150–400)
RBC: 4.18 MIL/uL (ref 3.87–5.11)
RDW: 12.5 % (ref 11.5–15.5)
WBC: 6.8 10*3/uL (ref 4.0–10.5)
nRBC: 0 % (ref 0.0–0.2)

## 2024-03-03 LAB — COMPREHENSIVE METABOLIC PANEL WITH GFR
ALT: 692 U/L — ABNORMAL HIGH (ref 0–44)
AST: 753 U/L — ABNORMAL HIGH (ref 15–41)
Albumin: 3.8 g/dL (ref 3.5–5.0)
Alkaline Phosphatase: 109 U/L (ref 38–126)
Anion gap: 11 (ref 5–15)
BUN: 8 mg/dL (ref 6–20)
CO2: 25 mmol/L (ref 22–32)
Calcium: 9.4 mg/dL (ref 8.9–10.3)
Chloride: 104 mmol/L (ref 98–111)
Creatinine, Ser: 0.82 mg/dL (ref 0.44–1.00)
GFR, Estimated: >60 mL/min (ref >60)
Glucose, Bld: 104 mg/dL — ABNORMAL HIGH (ref 70–99)
Potassium: 3.8 mmol/L (ref 3.5–5.1)
Sodium: 140 mmol/L (ref 135–145)
Total Bilirubin: 5.5 mg/dL — ABNORMAL HIGH (ref 0.0–1.2)
Total Protein: 7.6 g/dL (ref 6.5–8.1)

## 2024-03-03 LAB — POCT URINALYSIS DIP (MANUAL ENTRY)
Blood, UA: NEGATIVE
Glucose, UA: NEGATIVE mg/dL
Ketones, POC UA: NEGATIVE mg/dL
Leukocytes, UA: NEGATIVE
Nitrite, UA: NEGATIVE
Spec Grav, UA: 1.015 (ref 1.010–1.025)
Urobilinogen, UA: 1 U/dL
pH, UA: 6.5 (ref 5.0–8.0)

## 2024-03-03 LAB — POCT URINE PREGNANCY: Preg Test, Ur: NEGATIVE

## 2024-03-03 LAB — LIPASE, BLOOD: Lipase: 26 U/L (ref 11–51)

## 2024-03-03 NOTE — ED Triage Notes (Signed)
 Pt states she was seen last year in ED she was told she has gallstones and needed to call [facility]. She states that she tried to call [facility] but they didn't answer the phone. She states the pain meds and nausea med given at that visit aren't working but its been a long time since she took any.   She states that she is having increased abdominal pain and nausea.

## 2024-03-03 NOTE — ED Provider Notes (Addendum)
 [facility]    CSN: [id number] Arrival date & time: 03/03/24  1215      History   Chief Complaint Chief Complaint  Patient presents with   Abdominal Pain   Nausea    HPI Audrey Sanders is a 23 y.o. female.   Patient complains of gallbladder pain.  Patient reports that she was told a year ago that she has gallstones.  Patient states she was told that she needed follow-up but she has not seen anyone for follow-up.  Patient reports she has been having pain in her right upper abdomen.  Patient has had this pain multiple times in the past.  Patient denies any fever or chills she has had nausea.  She denies vomiting she is not having any diarrhea.  The history is provided by the patient and the spouse. No language interpreter was used.  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 No pertinent surgical history.  OB History     Gravida  1   Para  1   Term  1   Preterm  0   AB  0   Living  1      SAB  0   IAB  0   Ectopic  0   Multiple  0   Live Births  1            Home Medications    Prior to Admission medications   Medication Sig Start Date End Date Taking? Authorizing Provider  norethindrone (ORTHO MICRONOR) 0.35 MG tablet Take 1 tablet (0.35 mg total) by mouth daily. 02/14/23 02/14/24  Ndulue, Chiagoziem J, MD  ondansetron (ZOFRAN-ODT) 4 MG disintegrating tablet Take 1 tablet (4 mg total) by mouth every 8 (eight) hours as needed for nausea or vomiting. 07/08/23   Renne Crigler, PA-C  oxyCODONE (OXY IR/ROXICODONE) 5 MG immediate release tablet Take 1 tablet (5 mg total) by mouth every 6 (six) hours as needed for severe pain. 07/08/23   Renne Crigler, PA-C    Family History History reviewed. No pertinent family history.  Social  History Social History   Tobacco Use   Smoking status: Never    Passive exposure: Never   Smokeless tobacco: Never  Vaping Use   Vaping status: Never Used  Substance Use Topics   Alcohol use: Never   Drug use: Never     Allergies   Patient has no known allergies.   Review of Systems Review of Systems  Gastrointestinal:  Positive for abdominal pain.  All other systems reviewed and are negative.    Physical Exam Triage Vital Signs ED Triage Vitals  Encounter Vitals Group     BP 03/03/24 1319 109/68     Systolic BP Percentile --      Diastolic BP Percentile --      Pulse Rate 03/03/24 1319 82     Resp 03/03/24 1319 18     Temp 03/03/24 1319 98.1 F (36.7 C)     Temp Source 03/03/24 1319 Oral     SpO2 03/03/24 1319 97 %     Weight --      Height --      Head Circumference --      Peak Flow --      Pain Score  03/03/24 1318 9     Pain Loc --      Pain Education --      Exclude from Growth Chart --    No data found.  Updated Vital Signs BP 109/68 (BP Location: Left Arm)   Pulse 82   Temp 98.1 F (36.7 C) (Oral)   Resp 18   LMP 02/09/2024 (Approximate)   SpO2 97%   Visual Acuity Right Eye Distance:   Left Eye Distance:   Bilateral Distance:    Right Eye Near:   Left Eye Near:    Bilateral Near:     Physical Exam Vitals and nursing note reviewed.  Constitutional:      Appearance: She is well-developed.  HENT:     Head: Normocephalic.  Cardiovascular:     Rate and Rhythm: Normal rate.  Pulmonary:     Effort: Pulmonary effort is normal.  Abdominal:     General: Bowel sounds are normal. There is no distension.     Palpations: Abdomen is soft.     Tenderness: There is abdominal tenderness.  Musculoskeletal:        General: Normal range of motion.     Cervical back: Normal range of motion.  Skin:    General: Skin is warm.  Neurological:     General: No focal deficit present.     Mental Status: She is alert and oriented to person, place, and  time.      UC Treatments / Results  Labs (all labs ordered are listed, but only abnormal results are displayed) Labs Reviewed  COMPREHENSIVE METABOLIC PANEL WITH GFR - Abnormal; Notable for the following components:      Result Value   Glucose, Bld 104 (*)    AST 753 (*)    ALT 692 (*)    Total Bilirubin 5.5 (*)    All other components within normal limits  POCT URINALYSIS DIP (MANUAL ENTRY) - Abnormal; Notable for the following components:   Color, UA orange (*)    Bilirubin, UA large (*)    Protein Ur, POC trace (*)    All other components within normal limits  CBC WITH DIFFERENTIAL/PLATELET  LIPASE, BLOOD  POCT URINE PREGNANCY    EKG   Radiology No results found.  Procedures Procedures (including critical care time)  Medications Ordered in UC Medications - No data to display  Initial Impression / Assessment and Plan / UC Course  I have reviewed the triage vital signs and the nursing notes.  Pertinent labs & imaging results that were available during my care of the patient were reviewed by me and considered in my medical decision making (see chart for details).   (Pt told she was being discharged by staff and called her ride before labs returned)   CBC CMET lipase urine and urine pregnancy ordered.Urine pregnancy is negative UA shows large bilirubin and a trace of protein I ordered a CBC CMET and lipase.  While waiting for laboratory evaluation to return patient states that she needs to leave her she will miss her ride.  I discussed pending labs with patient.  I have advised her that I will call her with the results.  Patient's laboratory evaluations returned she has an AST of 753 ALT of 692 and her total bilirubin is 5.5.  Patient's white blood cell count is normal at 6.8. Pt needs to have an ultrasound and further evaluation.  I called pt and left a message for her to return my call.  Pt  needs to go to ED for imaging  Final Clinical Impressions(s) / UC Diagnoses    Final diagnoses:  Gallstones   Discharge Instructions   None    ED Prescriptions   None    PDMP not reviewed this encounter.   Elson Areas, PA-C 03/03/24 2017    Elson Areas, [place] 03/03/24 2022

## 2024-03-04 ENCOUNTER — Telehealth (HOSPITAL_COMMUNITY): Payer: Self-pay

## 2024-03-04 ENCOUNTER — Emergency Department (HOSPITAL_COMMUNITY)

## 2024-03-04 ENCOUNTER — Other Ambulatory Visit: Payer: Self-pay

## 2024-03-04 ENCOUNTER — Encounter (HOSPITAL_COMMUNITY): Payer: Self-pay | Admitting: *Deleted

## 2024-03-04 ENCOUNTER — Inpatient Hospital Stay (HOSPITAL_COMMUNITY)
Admission: EM | Admit: 2024-03-04 | Discharge: 2024-03-08 | DRG: 418 | Disposition: A | Discharge status: Home / Self Care | Attending: Family Medicine | Admitting: Family Medicine

## 2024-03-04 DIAGNOSIS — K8020 Calculus of gallbladder without cholecystitis without obstruction: Secondary | ICD-10-CM | POA: Diagnosis not present

## 2024-03-04 DIAGNOSIS — R7401 Elevation of levels of liver transaminase levels: Secondary | ICD-10-CM

## 2024-03-04 DIAGNOSIS — Q210 Ventricular septal defect: Secondary | ICD-10-CM | POA: Diagnosis not present

## 2024-03-04 DIAGNOSIS — K838 Other specified diseases of biliary tract: Secondary | ICD-10-CM | POA: Diagnosis present

## 2024-03-04 DIAGNOSIS — K660 Peritoneal adhesions (postprocedural) (postinfection): Secondary | ICD-10-CM | POA: Diagnosis present

## 2024-03-04 DIAGNOSIS — K810 Acute cholecystitis: Secondary | ICD-10-CM

## 2024-03-04 DIAGNOSIS — K8063 Calculus of gallbladder and bile duct with acute cholecystitis with obstruction: Principal | ICD-10-CM | POA: Diagnosis present

## 2024-03-04 DIAGNOSIS — E876 Hypokalemia: Secondary | ICD-10-CM | POA: Diagnosis present

## 2024-03-04 DIAGNOSIS — K3189 Other diseases of stomach and duodenum: Secondary | ICD-10-CM | POA: Diagnosis not present

## 2024-03-04 DIAGNOSIS — K8051 Calculus of bile duct without cholangitis or cholecystitis with obstruction: Principal | ICD-10-CM

## 2024-03-04 DIAGNOSIS — R109 Unspecified abdominal pain: Secondary | ICD-10-CM | POA: Diagnosis present

## 2024-03-04 DIAGNOSIS — N6310 Unspecified lump in the right breast, unspecified quadrant: Secondary | ICD-10-CM | POA: Diagnosis present

## 2024-03-04 DIAGNOSIS — R932 Abnormal findings on diagnostic imaging of liver and biliary tract: Secondary | ICD-10-CM | POA: Diagnosis not present

## 2024-03-04 DIAGNOSIS — R933 Abnormal findings on diagnostic imaging of other parts of digestive tract: Secondary | ICD-10-CM | POA: Diagnosis not present

## 2024-03-04 DIAGNOSIS — R110 Nausea: Secondary | ICD-10-CM | POA: Diagnosis not present

## 2024-03-04 DIAGNOSIS — R7989 Other specified abnormal findings of blood chemistry: Secondary | ICD-10-CM | POA: Diagnosis not present

## 2024-03-04 DIAGNOSIS — R1011 Right upper quadrant pain: Secondary | ICD-10-CM

## 2024-03-04 DIAGNOSIS — K831 Obstruction of bile duct: Secondary | ICD-10-CM

## 2024-03-04 LAB — CBC
HCT: 38.4 % (ref 36.0–46.0)
Hemoglobin: 12.8 g/dL (ref 12.0–15.0)
MCH: 30.6 pg (ref 26.0–34.0)
MCHC: 33.3 g/dL (ref 30.0–36.0)
MCV: 91.9 fL (ref 80.0–100.0)
Platelets: 264 10*3/uL (ref 150–400)
RBC: 4.18 MIL/uL (ref 3.87–5.11)
RDW: 12.5 % (ref 11.5–15.5)
WBC: 7.3 10*3/uL (ref 4.0–10.5)
nRBC: 0 % (ref 0.0–0.2)

## 2024-03-04 LAB — COMPREHENSIVE METABOLIC PANEL WITH GFR
ALT: 582 U/L — ABNORMAL HIGH (ref 0–44)
AST: 384 U/L — ABNORMAL HIGH (ref 15–41)
Albumin: 3.7 g/dL (ref 3.5–5.0)
Alkaline Phosphatase: 106 U/L (ref 38–126)
Anion gap: 10 (ref 5–15)
BUN: 9 mg/dL (ref 6–20)
CO2: 24 mmol/L (ref 22–32)
Calcium: 9.3 mg/dL (ref 8.9–10.3)
Chloride: 103 mmol/L (ref 98–111)
Creatinine, Ser: 0.91 mg/dL (ref 0.44–1.00)
GFR, Estimated: >60 mL/min (ref >60)
Glucose, Bld: 124 mg/dL — ABNORMAL HIGH (ref 70–99)
Potassium: 3.4 mmol/L — ABNORMAL LOW (ref 3.5–5.1)
Sodium: 137 mmol/L (ref 135–145)
Total Bilirubin: 6.1 mg/dL — ABNORMAL HIGH (ref 0.0–1.2)
Total Protein: 7.2 g/dL (ref 6.5–8.1)

## 2024-03-04 LAB — URINALYSIS, ROUTINE W REFLEX MICROSCOPIC
Glucose, UA: NEGATIVE mg/dL
Hgb urine dipstick: NEGATIVE
Ketones, ur: 20 mg/dL — AB
Leukocytes,Ua: NEGATIVE
Nitrite: NEGATIVE
Protein, ur: 30 mg/dL — AB
Specific Gravity, Urine: 1.016 (ref 1.005–1.030)
pH: 5 (ref 5.0–8.0)

## 2024-03-04 LAB — MAGNESIUM: Magnesium: 2.2 mg/dL (ref 1.7–2.4)

## 2024-03-04 LAB — LIPASE, BLOOD: Lipase: 25 U/L (ref 11–51)

## 2024-03-04 LAB — HCG, SERUM, QUALITATIVE: Preg, Serum: NEGATIVE

## 2024-03-04 MED ORDER — HYDROMORPHONE HCL 1 MG/ML IJ SOLN
0.5000 mg | INTRAMUSCULAR | Status: DC | PRN
  Administered 2024-03-05 – 2024-03-07 (×2): 0.5 mg via INTRAVENOUS
  Filled 2024-03-04 (×2): qty 0.5

## 2024-03-04 MED ORDER — POTASSIUM CHLORIDE 10 MEQ/100ML IV SOLN
10.0000 meq | INTRAVENOUS | Status: AC
Start: 2024-03-05 — End: 2024-03-05
  Administered 2024-03-05 (×2): 10 meq via INTRAVENOUS
  Filled 2024-03-04 (×2): qty 100

## 2024-03-04 MED ORDER — SODIUM CHLORIDE 0.9 % IV SOLN
2.0000 g | INTRAVENOUS | Status: DC
  Administered 2024-03-04 – 2024-03-05 (×2): 2 g via INTRAVENOUS
  Filled 2024-03-04 (×2): qty 20

## 2024-03-04 MED ORDER — ACETAMINOPHEN 650 MG RE SUPP: 650.0000 mg | Freq: Four times a day (QID) | RECTAL | Status: DC | PRN

## 2024-03-04 MED ORDER — ONDANSETRON HCL 4 MG/2ML IJ SOLN
4.0000 mg | Freq: Four times a day (QID) | INTRAMUSCULAR | Status: DC | PRN
  Administered 2024-03-05 – 2024-03-06 (×3): 4 mg via INTRAVENOUS
  Filled 2024-03-04 (×3): qty 2

## 2024-03-04 MED ORDER — ONDANSETRON HCL 4 MG/2ML IJ SOLN
4.0000 mg | Freq: Once | INTRAMUSCULAR | Status: AC
  Administered 2024-03-04: 4 mg via INTRAVENOUS
  Filled 2024-03-04: qty 2

## 2024-03-04 MED ORDER — ACETAMINOPHEN 325 MG PO TABS
650.0000 mg | ORAL_TABLET | Freq: Four times a day (QID) | ORAL | Status: DC | PRN
  Filled 2024-03-04: qty 2

## 2024-03-04 MED ORDER — MORPHINE SULFATE (PF) 4 MG/ML IV SOLN
4.0000 mg | Freq: Once | INTRAVENOUS | Status: AC
  Administered 2024-03-04: 4 mg via INTRAVENOUS
  Filled 2024-03-04: qty 1

## 2024-03-04 MED ORDER — LACTATED RINGERS IV SOLN: INTRAVENOUS | Status: AC

## 2024-03-04 MED ORDER — NALOXONE HCL 0.4 MG/ML IJ SOLN: 0.4000 mg | INTRAMUSCULAR | Status: DC | PRN

## 2024-03-04 MED ORDER — SODIUM CHLORIDE 0.9 % IV BOLUS
1000.0000 mL | Freq: Once | INTRAVENOUS | Status: AC
  Administered 2024-03-04: 1000 mL via INTRAVENOUS

## 2024-03-04 NOTE — ED Notes (Signed)
 Patient given a cup of water, ok per PA.

## 2024-03-04 NOTE — ED Provider Notes (Signed)
 [place] EMERGENCY DEPARTMENT AT [facility] Provider Note   CSN: [id number] Arrival date & time: 03/04/24  1355     History  Chief Complaint  Patient presents with   Abdominal Pain    Audrey Sanders is a 23 y.o. female.  23 year old female presents today for concern of right upper quadrant abdominal pain.  Has history of gallstones.  She states she has known that she has had gallstones now for a year but this is the worst episode that she has had within the past year.  She denies nausea, vomiting.  States currently her pain is 8/10.  Denies any urinary complaints.  Has not been seen by surgery team since she was told she has gallstones.  States that she was seen at the urgent care and had blood work done which showed worsening LFTs.  The history is provided by the patient. No language interpreter was used.       Home Medications Prior to Admission medications   Medication Sig Start Date End Date Taking? Authorizing Provider  norethindrone (ORTHO MICRONOR) 0.35 MG tablet Take 1 tablet (0.35 mg total) by mouth daily. 02/14/23 02/14/24  Ndulue, Chiagoziem J, MD  ondansetron (ZOFRAN-ODT) 4 MG disintegrating tablet Take 1 tablet (4 mg total) by mouth every 8 (eight) hours as needed for nausea or vomiting. 07/08/23   Renne Crigler, PA-C  oxyCODONE (OXY IR/ROXICODONE) 5 MG immediate release tablet Take 1 tablet (5 mg total) by mouth every 6 (six) hours as needed for severe pain. 07/08/23   Renne Crigler, PA-C      Allergies    Patient has no known allergies.    Review of Systems   Review of Systems  Constitutional:  Negative for chills and fever.  Gastrointestinal:  Positive for abdominal pain. Negative for nausea and vomiting.  Neurological:  Negative for light-headedness.  All other systems reviewed and are negative.   Physical Exam Updated Vital Signs BP 110/79   Pulse (!) 103   Temp 98.1 F (36.7 C) (Temporal)   Resp 16   LMP 02/09/2024 (Approximate)   SpO2  100%  Physical Exam Vitals and nursing note reviewed.  Constitutional:      General: She is not in acute distress.    Appearance: Normal appearance. She is not ill-appearing.  HENT:     Head: Normocephalic and atraumatic.     Nose: Nose normal.  Eyes:     General: No scleral icterus.    Extraocular Movements: Extraocular movements intact.     Conjunctiva/sclera: Conjunctivae normal.  Cardiovascular:     Rate and Rhythm: Normal rate and regular rhythm.  Pulmonary:     Effort: Pulmonary effort is normal. No respiratory distress.     Breath sounds: Normal breath sounds. No wheezing or rales.  Abdominal:     General: There is no distension.     Palpations: Abdomen is soft.     Tenderness: There is abdominal tenderness. There is no right CVA tenderness, left CVA tenderness or guarding.  Musculoskeletal:        General: Normal range of motion.     Cervical back: Normal range of motion.  Skin:    General: Skin is warm and dry.  Neurological:     General: No focal deficit present.     Mental Status: She is alert. Mental status is at baseline.     ED Results / Procedures / Treatments   Labs (all labs ordered are listed, but only abnormal results are  displayed) Labs Reviewed  COMPREHENSIVE METABOLIC PANEL WITH GFR - Abnormal; Notable for the following components:      Result Value   Potassium 3.4 (*)    Glucose, Bld 124 (*)    AST 384 (*)    ALT 582 (*)    Total Bilirubin 6.1 (*)    All other components within normal limits  URINALYSIS, ROUTINE W REFLEX MICROSCOPIC - Abnormal; Notable for the following components:   Color, Urine AMBER (*)    Bilirubin Urine MODERATE (*)    Ketones, ur 20 (*)    Protein, ur 30 (*)    Bacteria, UA RARE (*)    All other components within normal limits  LIPASE, BLOOD  CBC  HCG, SERUM, QUALITATIVE    EKG None  Radiology No results found.  Procedures Procedures    Medications Ordered in ED Medications  sodium chloride 0.9 % bolus  1,000 mL (1,000 mLs Intravenous New Bag/Given 03/04/24 1814)  ondansetron (ZOFRAN) injection 4 mg (4 mg Intravenous Given 03/04/24 1811)  morphine (PF) 4 MG/ML injection 4 mg (4 mg Intravenous Given 03/04/24 1812)    ED Course/ Medical Decision Making/ A&P                                 Medical Decision Making Amount and/or Complexity of Data Reviewed Labs: ordered.  Risk Prescription drug management. Decision regarding hospitalization.   Medical Decision Making / ED Course   This patient presents to the ED for concern of right upper quadrant abdominal pain, this involves an extensive number of treatment options, and is a complaint that carries with it a high risk of complications and morbidity.  The differential diagnosis includes cholecystitis, choledocholithiasis, pancreatitis, gastroenteritis  MDM: 23 year old female presents today for concern of right upper quadrant abdominal pain.  History of cholelithiasis.  Elevated LFTs on recent labs at urgent care.  CBC unremarkable, hemodynamically stable and afebrile and without tachycardia.  CMP with potassium of 3.4, AST 384, ALT 582, total bilirubin 6.1. Official read not back on ultrasound however I reviewed the findings and it shows cholelithiasis with dilated CBD.  Discussed with radiology to give a sooner read by radiologist.  Discussed with surgery who will come evaluate patient.  They recommend GI involvement and admission to medicine.  Ultrasound resulted and showed dilated CBD.  Recommend MRCP.   Discussed with gastroenterology who will consult.  Will discuss with hospitalist for admission.  Ceftriaxone started.  Pain controlled.  Pending discussion with hospitalist at shift change.  Signed out to oncoming provider.   Additional history obtained: -Additional history obtained from urgent care notes from yesterday's visit -External records from outside source obtained and reviewed including: Chart review including previous  notes, labs, imaging, consultation notes   Lab Tests: -I ordered, reviewed, and interpreted labs.   The pertinent results include:   Labs Reviewed  COMPREHENSIVE METABOLIC PANEL WITH GFR - Abnormal; Notable for the following components:      Result Value   Potassium 3.4 (*)    Glucose, Bld 124 (*)    AST 384 (*)    ALT 582 (*)    Total Bilirubin 6.1 (*)    All other components within normal limits  URINALYSIS, ROUTINE W REFLEX MICROSCOPIC - Abnormal; Notable for the following components:   Color, Urine AMBER (*)    Bilirubin Urine MODERATE (*)    Ketones, ur 20 (*)    Protein,  ur 30 (*)    Bacteria, UA RARE (*)    All other components within normal limits  LIPASE, BLOOD  CBC  HCG, SERUM, QUALITATIVE      EKG  EKG Interpretation Date/Time:    Ventricular Rate:    PR Interval:    QRS Duration:    QT Interval:    QTC Calculation:   R Axis:      Text Interpretation:           Imaging Studies ordered: I ordered imaging studies including RUQ [place] I independently visualized and interpreted imaging. I agree with the radiologist interpretation   Medicines ordered and prescription drug management: Meds ordered this encounter  Medications   sodium chloride 0.9 % bolus 1,000 mL   ondansetron (ZOFRAN) injection 4 mg   morphine (PF) 4 MG/ML injection 4 mg   cefTRIAXone (ROCEPHIN) 2 g in sodium chloride 0.9 % 100 mL IVPB    Antibiotic Indication::   Intra-abdominal    -I have reviewed the patients home medicines and have made adjustments as needed  Critical interventions Antibiotics, general surgery consult, GI consult  Consultations Obtained: I requested consultation with the general surgeon, gastroenterologist,  and discussed lab and imaging findings as well as pertinent plan - they recommend: As above  Reevaluation: After the interventions noted above, I reevaluated the patient and found that they have :improved  Co morbidities that complicate the patient  evaluation  Past Medical History:  Diagnosis Date   Gallstones       Dispostion: Signed out to oncoming provider pending discussion with hospitalist for admission.   Final Clinical Impression(s) / ED Diagnoses Final diagnoses:  Calculus of bile duct without cholecystitis with obstruction    Rx / DC Orders ED Discharge Orders     None         Marita Kansas, PA-C 03/04/24 2328    Charlynne Pander, MD 03/05/24 1452

## 2024-03-04 NOTE — H&P (Signed)
 History and Physical      Audrey Sanders:[id number] DOB: 11-25-2001 DOA: 03/04/2024; DOS: 03/04/2024  PCP: Pcp, No *** Patient coming from: home ***  I have personally briefly reviewed patient's old medical records in [facility]  Chief Complaint: ***  HPI: Audrey Sanders is a 23 y.o. female with medical history significant for *** who is admitted to [facility] on 03/04/2024 with *** after presenting from home*** to [facility] ED complaining of ***.    ***       ***   ED Course:  Vital signs in the ED were notable for the following: ***  Labs were notable for the following: ***  Per my interpretation, EKG in ED demonstrated the following:  ***  Imaging in the ED, per corresponding formal radiology read, was notable for the following:  ***  EDP has d/w on-call general surgery, Dr. Hillery Hunter, who will formally consult and anticipates cholecystectomy during this hospitalization. Additionally, EDP has contacted on-call [facility], Dr. Lavon Paganini, requesting consultation.   While in the ED, the following were administered: ***  Subsequently, the patient was admitted  ***  ***red    Review of Systems: As per HPI otherwise 10 point review of systems negative.   Past Medical History:  Diagnosis Date   Gallstones     History reviewed. No pertinent surgical history.  Social History:  reports that she has never smoked. She has never been exposed to tobacco smoke. She has never used smokeless tobacco. She reports that she does not drink alcohol and does not use drugs.   No Known Allergies  History reviewed. No pertinent family history.  Family history reviewed and not pertinent ***   Prior to Admission medications   Medication Sig Start Date End Date Taking? Authorizing Provider  norethindrone (ORTHO MICRONOR) 0.35 MG tablet Take 1 tablet (0.35 mg total) by mouth daily. 02/14/23 02/14/24  Ndulue, Chiagoziem J, MD  ondansetron (ZOFRAN-ODT) 4 MG disintegrating  tablet Take 1 tablet (4 mg total) by mouth every 8 (eight) hours as needed for nausea or vomiting. 07/08/23   Renne Crigler, PA-C  oxyCODONE (OXY IR/ROXICODONE) 5 MG immediate release tablet Take 1 tablet (5 mg total) by mouth every 6 (six) hours as needed for severe pain. 07/08/23   Renne Crigler, PA-C     Objective    Physical Exam: Vitals:   03/04/24 1814 03/04/24 1815 03/04/24 2122 03/04/24 2154  BP: 110/79  117/70 117/70  Pulse: (!) 103  89 98  Resp: 16  17 18   Temp: 98.1 F (36.7 C)  98.1 F (36.7 C) 98.1 F (36.7 C)  TempSrc: Temporal   Axillary  SpO2: 100% 100% 100% 100%    General: appears to be stated age; alert, oriented Skin: warm, dry, no rash Head:  AT/[place] Mouth:  Oral mucosa membranes appear moist, normal dentition Neck: supple; trachea midline Heart:  RRR; did not appreciate any M/R/G Lungs: CTAB, did not appreciate any wheezes, rales, or rhonchi Abdomen: + BS; soft, ND, NT Vascular: 2+ pedal pulses b/l; 2+ radial pulses b/l Extremities: no peripheral edema, no muscle wasting Neuro: strength and sensation intact in upper and lower extremities b/l ***   *** Neuro: 5/5 strength of the proximal and distal flexors and extensors of the upper and lower extremities bilaterally; sensation intact in upper and lower extremities b/l; cranial nerves II through XII grossly intact; no pronator drift; no evidence suggestive of slurred speech, dysarthria, or facial droop; Normal muscle tone. No  tremors.  *** Neuro: In the setting of the patient's current mental status and associated inability to follow instructions, unable to perform full neurologic exam at this time.  As such, assessment of strength, sensation, and cranial nerves is limited at this time. Patient noted to spontaneously move all 4 extremities. No tremors.  ***    Labs on Admission: I have personally reviewed following labs and imaging studies  CBC: Recent Labs  Lab 03/03/24 1407 03/04/24 1448  WBC  6.8 7.3  NEUTROABS 3.7  --   HGB 12.8 12.8  HCT 38.1 38.4  MCV 91.1 91.9  PLT 245 264   Basic Metabolic Panel: Recent Labs  Lab 03/03/24 1407 03/04/24 1448  NA 140 137  K 3.8 3.4*  CL 104 103  CO2 25 24  GLUCOSE 104* 124*  BUN 8 9  CREATININE 0.82 0.91  CALCIUM 9.4 9.3   GFR: CrCl cannot be calculated (Unknown ideal weight.). Liver Function Tests: Recent Labs  Lab 03/03/24 1407 03/04/24 1448  AST 753* 384*  ALT 692* 582*  ALKPHOS 109 106  BILITOT 5.5* 6.1*  PROT 7.6 7.2  ALBUMIN 3.8 3.7   Recent Labs  Lab 03/03/24 1407 03/04/24 1448  LIPASE 26 25   No results for input(s): "AMMONIA" in the last 168 hours. Coagulation Profile: No results for input(s): "INR", "PROTIME" in the last 168 hours. Cardiac Enzymes: No results for input(s): "CKTOTAL", "CKMB", "CKMBINDEX", "TROPONINI" in the last 168 hours. BNP (last 3 results) No results for input(s): "PROBNP" in the last 8760 hours. HbA1C: No results for input(s): "HGBA1C" in the last 72 hours. CBG: No results for input(s): "GLUCAP" in the last 168 hours. Lipid Profile: No results for input(s): "CHOL", "HDL", "LDLCALC", "TRIG", "CHOLHDL", "LDLDIRECT" in the last 72 hours. Thyroid Function Tests: No results for input(s): "TSH", "T4TOTAL", "FREET4", "T3FREE", "THYROIDAB" in the last 72 hours. Anemia Panel: No results for input(s): "VITAMINB12", "FOLATE", "FERRITIN", "TIBC", "IRON", "RETICCTPCT" in the last 72 hours. Urine analysis:    Component Value Date/Time   COLORURINE AMBER (A) 03/04/2024 1448   APPEARANCEUR CLEAR 03/04/2024 1448   LABSPEC 1.016 03/04/2024 1448   PHURINE 5.0 03/04/2024 1448   GLUCOSEU NEGATIVE 03/04/2024 1448   HGBUR NEGATIVE 03/04/2024 1448   BILIRUBINUR MODERATE (A) 03/04/2024 1448   BILIRUBINUR large (A) 03/03/2024 1420   KETONESUR 20 (A) 03/04/2024 1448   PROTEINUR 30 (A) 03/04/2024 1448   UROBILINOGEN 1.0 03/03/2024 1420   NITRITE NEGATIVE 03/04/2024 1448   LEUKOCYTESUR NEGATIVE  03/04/2024 1448    Radiological Exams on Admission: US Abdomen Limited RUQ (LIVER/GB) Result Date: 03/04/2024 CLINICAL DATA:  Elevated liver function studies. EXAM: ULTRASOUND ABDOMEN LIMITED RIGHT UPPER QUADRANT COMPARISON:  Prior study 07/08/2023 FINDINGS: Gallbladder: Multiple echogenic shadowing gallstones also noted on the prior study. No gallbladder wall thickening, pericholecystic fluid or sonographic Murphy sign to suggest acute cholecystitis. Common bile duct: Diameter: Abnormally dilated at 11.7 mm. There is also mild intrahepatic biliary dilatation. Findings suspicious for obstructing common bile duct stone. Recommend MRI abdomen/MRCP further evaluation. Liver: Normal echogenicity without focal lesion or biliary dilatation. Portal vein is patent on color Doppler imaging with normal direction of blood flow towards the liver. Other: No ascites. IMPRESSION: 1. Cholelithiasis but no sonographic findings for acute cholecystitis. 2. Abnormally dilated common bile duct and mild intrahepatic biliary dilatation. Findings suspicious for obstructing common bile duct stone. Recommend MRI abdomen/MRCP for further evaluation. Electronically Signed   By: Rudie Meyer M.D.   On: 03/04/2024 21:40  Assessment/Plan   Principal Problem:   Acute cholecystitis   ***            ***                  ***                   ***                  ***                  ***                  ***                   ***                  ***                  ***                  ***                  ***                 ***                ***  DVT prophylaxis: SCD's ***  Code Status: Full code*** Family Communication: none*** Disposition Plan: Per Rounding Team Consults called: EDP has d/w  on-call general surgery, Dr. Hillery Hunter, who will formally consult and anticipates cholecystectomy during this hospitalization. Additionally, EDP has contacted on-call [facility], Dr. Lavon Paganini, requesting consultation. ;  Admission status: ***     I SPENT GREATER THAN 75 *** MINUTES IN CLINICAL CARE TIME/MEDICAL DECISION-MAKING IN COMPLETING THIS ADMISSION.      Chaney Born Dasja Brase DO [facility]  From 7PM - 7AM   03/04/2024, 11:44 PM   ***

## 2024-03-04 NOTE — ED Triage Notes (Signed)
 Pt is here due to hx of gallstones.  Pt was told to follow up with [facility] but she has not done so.  She was seen at [facility] yesterday and had blood work done and they contacted her today and told her that her liver function is elevated and to come to the ED.  Pain is "ok" at this time.

## 2024-03-04 NOTE — Consult Note (Signed)
 Audrey Sanders [birth year]  [id number].    Requesting MD: Silverio Lay Chief Complaint/Reason for Consult: Choledocholithiasis  HPI:  23 y/o F w/ a known history of gallstones who presented to the ED after she was told that her lab work revealed an abnormality. She initially presented to an urgent care on 4/7 with abdominal pain and nausea. CMP was performed and showed a Tbili of 5.5. On arrival to the ED today repeat CMP shows a Tbili of 6.1 with elevated AST/ALT. A RUQ US showed signs of cholecystitis with CBD dilation.  On exam, patient is resting comfortably. She reports ongoing abdominal pain and nausea.   ROS: Review of Systems  Constitutional: Negative.   HENT: Negative.    Eyes: Negative.   Respiratory: Negative.    Cardiovascular: Negative.   Gastrointestinal:  Positive for abdominal pain, nausea and vomiting.  Genitourinary: Negative.   Musculoskeletal: Negative.   Skin: Negative.   Neurological: Negative.   Endo/Heme/Allergies: Negative.   Psychiatric/Behavioral: Negative.      History reviewed. No pertinent family history.  Past Medical History:  Diagnosis Date   Gallstones     History reviewed. No pertinent surgical history.  Social History:  reports that she has never smoked. She has never been exposed to tobacco smoke. She has never used smokeless tobacco. She reports that she does not drink alcohol and does not use drugs.  Allergies: No Known Allergies  (Not in a hospital admission)   Physical Exam: Blood pressure 110/79, pulse (!) 103, temperature 98.1 F (36.7 C), temperature source Temporal, resp. rate 16, last menstrual period 02/09/2024, SpO2 100%, not currently breastfeeding. Gen: female, resting comfortably Abd: soft, non-distended, mild TTP in the RUQ  Results for orders placed or performed during the hospital encounter of 03/04/24 (from the past 48 hours)  Lipase, blood     Status: None   Collection Time: 03/04/24  2:48 PM  Result Value Ref  Range   Lipase 25 11 - 51 U/L    Comment: Performed at [facility], [address], [place], [place] [address]  Comprehensive metabolic panel     Status: Abnormal   Collection Time: 03/04/24  2:48 PM  Result Value Ref Range   Sodium 137 135 - 145 mmol/L   Potassium 3.4 (L) 3.5 - 5.1 mmol/L   Chloride 103 98 - 111 mmol/L   CO2 24 22 - 32 mmol/L   Glucose, Bld 124 (H) 70 - 99 mg/dL    Comment: Glucose reference range applies only to samples taken after fasting for at least 8 hours.   BUN 9 6 - 20 mg/dL   Creatinine, Ser 6.21 0.44 - 1.00 mg/dL   Calcium 9.3 8.9 - 30.8 mg/dL   Total Protein 7.2 6.5 - 8.1 g/dL   Albumin 3.7 3.5 - 5.0 g/dL   AST 657 (H) 15 - 41 U/L   ALT 582 (H) 0 - 44 U/L   Alkaline Phosphatase 106 38 - 126 U/L   Total Bilirubin 6.1 (H) 0.0 - 1.2 mg/dL   GFR, Estimated >84 >69 mL/min    Comment: (NOTE) Calculated using the CKD-EPI Creatinine Equation (2021)    Anion gap 10 5 - 15    Comment: Performed at [facility], [address], [place], [place] [address]  CBC     Status: None   Collection Time: 03/04/24  2:48 PM  Result Value Ref Range   WBC 7.3 4.0 - 10.5 K/uL   RBC  4.18 3.87 - 5.11 MIL/uL   Hemoglobin 12.8 12.0 - 15.0 g/dL   HCT 52.8 41.3 - 24.4 %   MCV 91.9 80.0 - 100.0 fL   MCH 30.6 26.0 - 34.0 pg   MCHC 33.3 30.0 - 36.0 g/dL   RDW 01.0 27.2 - 53.6 %   Platelets 264 150 - 400 K/uL   nRBC 0.0 0.0 - 0.2 %    Comment: Performed at [facility], [address], [place], [place] [address]  Urinalysis, Routine w reflex microscopic -Urine, Clean Catch     Status: Abnormal   Collection Time: 03/04/24  2:48 PM  Result Value Ref Range   Color, Urine AMBER (A) YELLOW    Comment: BIOCHEMICALS MAY BE AFFECTED BY COLOR   APPearance CLEAR CLEAR   Specific Gravity, Urine 1.016 1.005 - 1.030   pH 5.0 5.0 - 8.0   Glucose, UA NEGATIVE NEGATIVE mg/dL   Hgb urine dipstick NEGATIVE NEGATIVE   Bilirubin Urine MODERATE (A) NEGATIVE   Ketones, ur 20  (A) NEGATIVE mg/dL   Protein, ur 30 (A) NEGATIVE mg/dL   Nitrite NEGATIVE NEGATIVE   Leukocytes,Ua NEGATIVE NEGATIVE   RBC / HPF 0-5 0 - 5 RBC/hpf   WBC, UA 0-5 0 - 5 WBC/hpf   Bacteria, UA RARE (A) NONE SEEN   Squamous Epithelial / HPF 0-5 0 - 5 /HPF   Mucus PRESENT     Comment: Performed at [facility], [address] [place], [place] [address]  hCG, serum, qualitative     Status: None   Collection Time: 03/04/24  2:48 PM  Result Value Ref Range   Preg, Serum NEGATIVE NEGATIVE    Comment:        THE SENSITIVITY OF THIS METHODOLOGY IS >10 mIU/mL. Performed at [facility], [address], [place], [place] [address]    No results found.  Assessment/Plan 23 y/o F w/ a known history of gallstones who presents with abdominal pain and nausea and has imaging c/f cholecystitis w/ choledocholithiasis   - Admit to medicine - Rocephin - Will need GI consult to eval for ERCP. MRCP ordered  - Surgery will continue to follow. Timing of possible cholecystectomy pending above.   Tacy Learn Surgery 03/04/2024, 9:31 PM Please see Amion for pager number during day hours 7:00am-4:30pm or 7:00am -11:30am on weekends

## 2024-03-05 ENCOUNTER — Inpatient Hospital Stay (HOSPITAL_COMMUNITY)

## 2024-03-05 DIAGNOSIS — K8051 Calculus of bile duct without cholangitis or cholecystitis with obstruction: Principal | ICD-10-CM

## 2024-03-05 DIAGNOSIS — R1011 Right upper quadrant pain: Secondary | ICD-10-CM | POA: Diagnosis not present

## 2024-03-05 DIAGNOSIS — R7989 Other specified abnormal findings of blood chemistry: Secondary | ICD-10-CM

## 2024-03-05 DIAGNOSIS — R933 Abnormal findings on diagnostic imaging of other parts of digestive tract: Secondary | ICD-10-CM | POA: Diagnosis not present

## 2024-03-05 DIAGNOSIS — E876 Hypokalemia: Secondary | ICD-10-CM | POA: Diagnosis present

## 2024-03-05 DIAGNOSIS — R110 Nausea: Secondary | ICD-10-CM | POA: Diagnosis present

## 2024-03-05 DIAGNOSIS — K810 Acute cholecystitis: Secondary | ICD-10-CM | POA: Diagnosis not present

## 2024-03-05 DIAGNOSIS — R7401 Elevation of levels of liver transaminase levels: Secondary | ICD-10-CM | POA: Diagnosis present

## 2024-03-05 DIAGNOSIS — K831 Obstruction of bile duct: Secondary | ICD-10-CM

## 2024-03-05 LAB — COMPREHENSIVE METABOLIC PANEL WITH GFR
ALT: 515 U/L — ABNORMAL HIGH (ref 0–44)
AST: 267 U/L — ABNORMAL HIGH (ref 15–41)
Albumin: 3.5 g/dL (ref 3.5–5.0)
Alkaline Phosphatase: 108 U/L (ref 38–126)
Anion gap: 15 (ref 5–15)
BUN: 7 mg/dL (ref 6–20)
CO2: 22 mmol/L (ref 22–32)
Calcium: 8.6 mg/dL — ABNORMAL LOW (ref 8.9–10.3)
Chloride: 105 mmol/L (ref 98–111)
Creatinine, Ser: 0.78 mg/dL (ref 0.44–1.00)
GFR, Estimated: >60 mL/min (ref >60)
Glucose, Bld: 88 mg/dL (ref 70–99)
Potassium: 3.7 mmol/L (ref 3.5–5.1)
Sodium: 142 mmol/L (ref 135–145)
Total Bilirubin: 3.2 mg/dL — ABNORMAL HIGH (ref 0.0–1.2)
Total Protein: 7 g/dL (ref 6.5–8.1)

## 2024-03-05 LAB — CBC WITH DIFFERENTIAL/PLATELET
Abs Immature Granulocytes: 0.02 10*3/uL (ref 0.00–0.07)
Basophils Absolute: 0 10*3/uL (ref 0.0–0.1)
Basophils Relative: 1 %
Eosinophils Absolute: 0.1 10*3/uL (ref 0.0–0.5)
Eosinophils Relative: 1 %
HCT: 39 % (ref 36.0–46.0)
Hemoglobin: 13.1 g/dL (ref 12.0–15.0)
Immature Granulocytes: 0 %
Lymphocytes Relative: 28 %
Lymphs Abs: 2.1 10*3/uL (ref 0.7–4.0)
MCH: 30.6 pg (ref 26.0–34.0)
MCHC: 33.6 g/dL (ref 30.0–36.0)
MCV: 91.1 fL (ref 80.0–100.0)
Monocytes Absolute: 0.7 10*3/uL (ref 0.1–1.0)
Monocytes Relative: 9 %
Neutro Abs: 4.8 10*3/uL (ref 1.7–7.7)
Neutrophils Relative %: 61 %
Platelets: 251 10*3/uL (ref 150–400)
RBC: 4.28 MIL/uL (ref 3.87–5.11)
RDW: 12.6 % (ref 11.5–15.5)
WBC: 7.7 10*3/uL (ref 4.0–10.5)
nRBC: 0 % (ref 0.0–0.2)

## 2024-03-05 LAB — TYPE AND SCREEN
ABO/RH(D): O POS
Antibody Screen: NEGATIVE

## 2024-03-05 LAB — PROTIME-INR
INR: 1.1 (ref 0.8–1.2)
Prothrombin Time: 14.3 s (ref 11.4–15.2)

## 2024-03-05 LAB — MAGNESIUM: Magnesium: 2.1 mg/dL (ref 1.7–2.4)

## 2024-03-05 LAB — BILIRUBIN, DIRECT: Bilirubin, Direct: 1.5 mg/dL — ABNORMAL HIGH (ref 0.0–0.2)

## 2024-03-05 MED ORDER — DICLOFENAC SUPPOSITORY 100 MG
100.0000 mg | Freq: Once | RECTAL | Status: AC
Start: 2024-03-05 — End: 2024-03-05
  Administered 2024-03-05: 100 mg via RECTAL
  Filled 2024-03-05 (×2): qty 1

## 2024-03-05 MED ORDER — MUPIROCIN 2 % EX OINT
1.0000 | TOPICAL_OINTMENT | Freq: Two times a day (BID) | CUTANEOUS | Status: AC
  Administered 2024-03-05 – 2024-03-07 (×4): 1 via NASAL
  Filled 2024-03-05 (×3): qty 22

## 2024-03-05 MED ORDER — SODIUM CHLORIDE 0.9 % IV SOLN: INTRAVENOUS | Status: DC

## 2024-03-05 MED ORDER — GADOBUTROL 1 MMOL/ML IV SOLN
10.0000 mL | Freq: Once | INTRAVENOUS | Status: AC | PRN
  Administered 2024-03-05: 10 mL via INTRAVENOUS

## 2024-03-05 NOTE — H&P (View-Only) (Signed)
 [facility] Consult Note   History Audrey Sanders MRN # [id number]  Date of Admission: 03/04/2024 Date of Consultation: 03/05/2024 Referring physician: Dr. Rhetta Mura, MD Primary Care Provider: Pcp, No Primary Gastroenterologist: None   Reason for Consultation/Chief Complaint: Obstructive jaundice and gallstones with upper abdominal pain  Subjective  HPI:  23 year old woman with biliary colic from gallstones with elevated transaminases discovered October 2024.  She was advised to follow-up with primary care and get a referral to general surgery.  She does not seem to have a primary care provider and did not know who more how to contact the general surgery so that was never done.  She is had worsening symptoms of postprandial epigastric to right upper quadrant pain for several months and more so in the last couple of weeks. Which at urgent care for this 2 days ago found to have significant elevation of LFTs from the prior values including elevated bilirubin and ultrasound suggesting biliary obstruction with a dilated common bile duct, prompting hospital admission. Audrey Sanders says the pain is improved from what it was at the time of admission, but she has needed regular doses of pain medicine.  She denies chronic nausea or vomiting.  Appetite has been off at times due to the pain and she believes she has lost some weight but unable to specify the amount or duration. Bowel habits are regular and she has not been experiencing diarrhea or rectal bleeding.  No known family history of Crohn's/colitis or liver diseases. General surgery has seen this patient in consultation and is recommending a cholecystectomy after evaluation of her bile duct.  ROS:  Remainder systems negative except as above  All other systems are negative except as noted above in the HPI  Past Medical History Past Medical History:  Diagnosis Date   Gallstones   Ventricular septal defect identified on  echocardiogram 2017-she had postpartum evaluation at [facility] cardiology on 03/09/2023, further details in that note.  Patient was reportedly asymptomatic and no further testing or treatment was recommended 2-year follow-up planned  She delivered her only child by spontaneous vaginal delivery in March 2024  Past Surgical History History reviewed. No pertinent surgical history.  Family History History reviewed. No pertinent family history.  Social History Social History   Socioeconomic History   Marital status: Single    Spouse name: Not on file   Number of children: Not on file   Years of education: Not on file   Highest education level: GED or equivalent  Occupational History   Not on file  Tobacco Use   Smoking status: Never    Passive exposure: Never   Smokeless tobacco: Never  Vaping Use   Vaping status: Never Used  Substance and Sexual Activity   Alcohol use: Never   Drug use: Never   Sexual activity: Yes    Birth control/protection: None  Other Topics Concern   Not on file  Social History Narrative   Not on file   Social Drivers of Health   Financial Resource Strain: Patient Declined (12/06/2022)   Overall Financial Resource Strain (CARDIA)    Difficulty of Paying Living Expenses: Patient declined  Food Insecurity: No Food Insecurity (03/05/2024)   Hunger Vital Sign    Worried About Running Out of Food in the Last Year: Never true    Ran Out of Food in the Last Year: Never true  Transportation Needs: No Transportation Needs (03/05/2024)   PRAPARE - Administrator, Civil Service (Medical): No  Lack of Transportation (Non-Medical): No  Physical Activity: Unknown (12/06/2022)   Exercise Vital Sign    Days of Exercise per Week: 0 days    Minutes of Exercise per Session: Not on file  Stress: No Stress Concern Present (12/06/2022)   [employer] of Occupational Health - Occupational Stress Questionnaire    Feeling of Stress : Only a little  Social  Connections: Socially Isolated (03/05/2024)   Social Connection and Isolation Panel [NHANES]    Frequency of Communication with Friends and Family: Three times a week    Frequency of Social Gatherings with Friends and Family: Three times a week    Attends Religious Services: Never    Active Member of Clubs or Organizations: No    Attends Banker Meetings: Never    Marital Status: Never married    Allergies No Known Allergies  Outpatient Meds Home medications from the H+P and/or nursing med reconciliation reviewed.  Inpatient med list reviewed  _____________________________________________________________________ Objective   Exam:  Current vital signs  Patient Vitals for the past 8 hrs:  BP Temp Temp src Pulse Resp SpO2  03/05/24 0850 121/75 98.2 F (36.8 C) Oral 97 18 100 %  03/05/24 0543 112/69 98.3 F (36.8 C) Oral 79 16 99 %    Intake/Output Summary (Last 24 hours) at 03/05/2024 1024 Last data filed at 03/04/2024 2255 Gross per 24 hour  Intake 1098.27 ml  Output --  Net 1098.27 ml    Physical Exam: Her boyfriend was present in the room for the entire visit  General: this is a well-appearing young patient in no acute distress Eyes: sclera icteric, no redness ENT: oral mucosa moist without lesions, no cervical or supraclavicular lymphadenopathy, normal dentition and oral opening, full neck ROM CV: RRR without murmur, S1/S2, no JVD,, no peripheral edema Resp: clear to auscultation bilaterally, normal RR and effort noted GI: soft, no tenderness, with active bowel sounds. No guarding or palpable organomegaly noted Skin; warm and dry, no rash or jaundice noted difficult to appreciate jaundice with dark skin tone Neuro: awake, alert and oriented x 3. Normal gross motor function and fluent speech.  Labs:     Latest Ref Rng & Units 03/05/2024    1:36 AM 03/04/2024    2:48 PM 03/03/2024    2:07 PM  CBC  WBC 4.0 - 10.5 K/uL 7.7  7.3  6.8   Hemoglobin 12.0 - 15.0  g/dL 60.4  54.0  98.1   Hematocrit 36.0 - 46.0 % 39.0  38.4  38.1   Platelets 150 - 400 K/uL 251  264  245        Latest Ref Rng & Units 03/05/2024    1:36 AM 03/04/2024    2:48 PM 03/03/2024    2:07 PM  CMP  Glucose 70 - 99 mg/dL 88  191  478   BUN 6 - 20 mg/dL 7  9  8    Creatinine 0.44 - 1.00 mg/dL 2.95  6.21  3.08   Sodium 135 - 145 mmol/L 142  137  140   Potassium 3.5 - 5.1 mmol/L 3.7  3.4  3.8   Chloride 98 - 111 mmol/L 105  103  104   CO2 22 - 32 mmol/L 22  24  25    Calcium 8.9 - 10.3 mg/dL 8.6  9.3  9.4   Total Protein 6.5 - 8.1 g/dL 7.0  7.2  7.6   Total Bilirubin 0.0 - 1.2 mg/dL 3.2  6.1  5.5  Alkaline Phos 38 - 126 U/L 108  106  109   AST 15 - 41 U/L 267  384  753   ALT 0 - 44 U/L 515  582  692     Recent Labs  Lab 03/05/24 0136  INR 1.1   Negative pregnancy test 03/03/2024 _________________________________________________________ Radiologic studies: CLINICAL DATA:  Elevated liver function studies.   EXAM: ULTRASOUND ABDOMEN LIMITED RIGHT UPPER QUADRANT   COMPARISON:  Prior study 07/08/2023   FINDINGS: Gallbladder:   Multiple echogenic shadowing gallstones also noted on the prior study. No gallbladder wall thickening, pericholecystic fluid or sonographic Murphy sign to suggest acute cholecystitis.   Common bile duct:   Diameter: Abnormally dilated at 11.7 mm. There is also mild intrahepatic biliary dilatation. Findings suspicious for obstructing common bile duct stone. Recommend MRI abdomen/MRCP further evaluation.   Liver:   Normal echogenicity without focal lesion or biliary dilatation. Portal vein is patent on color Doppler imaging with normal direction of blood flow towards the liver.   Other: No ascites.   IMPRESSION: 1. Cholelithiasis but no sonographic findings for acute cholecystitis. 2. Abnormally dilated common bile duct and mild intrahepatic biliary dilatation. Findings suspicious for obstructing common bile duct stone. Recommend MRI  abdomen/MRCP for further evaluation.     Electronically Signed   By: Rudie Meyer M.D.   On: 03/04/2024 21:40   _________________  CLINICAL DATA:  Jaundice.   EXAM: MRI ABDOMEN WITHOUT AND WITH CONTRAST (INCLUDING MRCP)   TECHNIQUE: Multiplanar multisequence MR imaging of the abdomen was performed both before and after the administration of intravenous contrast. Heavily T2-weighted images of the biliary and pancreatic ducts were obtained, and three-dimensional MRCP images were rendered by post processing.   CONTRAST:  10mL GADAVIST GADOBUTROL 1 MMOL/ML IV SOLN   COMPARISON:  Right upper quadrant sonogram 03/04/2024   FINDINGS: Lower chest: No acute findings.   Hepatobiliary: No enhancing liver lesion identified. Gallstones identified. These measure up to 7 mm. The gallbladder appears distended without wall thickening or pericholecystic fluid/inflammation.   Intrahepatic and common bile duct dilatation. Beak like narrowing of the distal common bile duct is noted without signs of choledocholithiasis.   Pancreas: There is no main duct dilatation. Congenitally absent tail of pancreas. No pancreatic inflammation or mass identified.   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   Adrenals/Urinary Tract: Normal adrenal glands. There is a striated nephrographic appearance of both kidneys. No mass or hydronephrosis identified.   Stomach/Bowel: Visualized portions within the abdomen are unremarkable.   Vascular/Lymphatic: Normal caliber of the abdominal aorta. No signs of abdominal adenopathy. Azygous continuation of the IVC.   Other:  No ascites or focal fluid collections.   Musculoskeletal: Within the right breast there is an enhancing nodule which measures 2.3 by 1.5 cm, image 06/1000. No enhancing bone lesions identified.   IMPRESSION: 1. Intrahepatic and common bile duct dilatation. Beak like narrowing of the distal  common bile duct is noted without signs of choledocholithiasis. 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 2. Cholelithiasis. No signs of acute cholecystitis. 3. Striated nephrographic appearance of both kidneys. Findings are nonspecific but can be seen in the setting of pyelonephritis. Correlate with urinalysis. 4. Enhancing nodule within the right breast measures 2.3 x 1.5 cm. Recommend further evaluation with dedicated breast imaging. 5. 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 6. Azygous continuation of the IVC.     Electronically Signed   By: Veronda Prude.D.  On: 03/05/2024 05:16    ______________________________________________________ Other studies: March 2024 echocardiogram  TTE 12/27/2022 IMPRESSIONS     1. Muscular VSD in 2 o'clock position on short axis, mid to distal septum  4 chamber view. Normal RV size. Marland Kitchen Left ventricular ejection fraction, by  estimation, is 55 to 60%. Left ventricular ejection fraction by 3D volume  is 61 %. The left ventricle has  normal function. The left ventricle has no regional wall motion  abnormalities. Left ventricular diastolic parameters were normal. The  average left ventricular global longitudinal strain is -23.3 %. The global  longitudinal strain is normal.   2. Right ventricular systolic function is normal. The right ventricular  size is normal. There is normal pulmonary artery systolic pressure. The  estimated right ventricular systolic pressure is 18.4 mmHg.   3. The mitral valve is normal in structure. Trivial mitral valve  regurgitation. No evidence of mitral stenosis.   4. The aortic valve is tricuspid. Aortic valve regurgitation is not  visualized. No aortic stenosis is present.   5. The inferior vena cava is normal in size with greater than 50%  respiratory variability, suggesting right atrial pressure of 3 mmHg.   FINDINGS   Left Ventricle: Muscular VSD in 2 o'clock  position on short axis, mid to  distal septum 4 chamber view. Normal RV size. Left ventricular ejection  fraction, by estimation, is 55 to 60%. Left ventricular ejection fraction  by 3D volume is 61 %. The left  ventricle has normal function. The left ventricle has no regional wall  motion abnormalities. The average left ventricular global longitudinal  strain is -23.3 %. The global longitudinal strain is normal. The left  ventricular internal cavity size was  normal in size. There is no left ventricular hypertrophy. Left ventricular  diastolic parameters were normal.   Right Ventricle: The right ventricular size is normal. No increase in  right ventricular wall thickness. Right ventricular systolic function is  normal. There is normal pulmonary artery systolic pressure. The tricuspid  regurgitant velocity is 1.96 m/s, and   with an assumed right atrial pressure of 3 mmHg, the estimated right  ventricular systolic pressure is 18.4 mmHg.   Left Atrium: Left atrial size was normal in size.   Right Atrium: Right atrial size was normal in size.   Pericardium: There is no evidence of pericardial effusion.   Mitral Valve: The mitral valve is normal in structure. Trivial mitral  valve regurgitation. No evidence of mitral valve stenosis.   Tricuspid Valve: The tricuspid valve is normal in structure. Tricuspid  valve regurgitation is mild . No evidence of tricuspid stenosis.   Aortic Valve: The aortic valve is tricuspid. Aortic valve regurgitation is  not visualized. No aortic stenosis is present.   Pulmonic Valve: The pulmonic valve was normal in structure. Pulmonic valve  regurgitation is mild. No evidence of pulmonic stenosis.   Aorta: The aortic root is normal in size and structure.   Venous: The inferior vena cava is normal in size with greater than 50%  respiratory variability, suggesting right atrial pressure of 3 mmHg.   IAS/Shunts: No atrial level shunt detected by color flow  Doppler.    TTE 2017 Veins  Normal systemic venous drainage. Three pulmonary veins seen returning to the  left atrium. Normal pulmonary vein velocity.   Atrium  Normal right atrial size. Normal left atrial size. No evidence of atrial  septal defect.   Atrioventricular Valves  Normal mitral valve. No mitral valve insufficiency.  Normal mitral valve  doppler inflow pattern. Normal tricuspid valve. Trivial tricuspid  insufficiency (physiologic). Right ventricular pressure estimate based on  tricuspid regurgitant jet equals 20 mmHg, plus right atrial pressure.   Left Ventricle  Normal left ventricle structure. Normal left ventricular dimension. Normal  left ventricular systolic function. Normal left ventricular diastolic  function.   Right Ventricle  Normal right ventricle structure. Normal right ventricular dimension. Normal  right ventricular systolic function.  Ventricular Septum  Normal septal curvature. Multiple muscular ventricular septal defects.  Aortic Valve  Normal trileaftlet aortic valve. No aortic valve insufficiency. No aortic  valve stenosis.  Pulmonary Valve  Normal pulmonic valve. No pulmonary valve stenosis. Trivial pulmonary  insufficiency (physiologic).  Conotruncus  Normal conotruncus.  Aorta   Left aortic arch with normal branching pattern. Normal aortic root size.  Unobstructed aortic arch. Normal pulsatile flow in descending aorta.  Ascending aortic velocity normal. Descending aortic velocity normal.  Pulmonary Artery  Normal pulmonary artery branches. No right pulmonary artery stenosis. No left  pulmonary artery stenosis.  Ductus Arteriosus  No patent ductus arteriosus.   Coronary Arteries  Normal coronary artery size and origins.  Fluid  No pericardial effusion.   _______________________________________________________ Assessment & Plan  Impression: Obstructive jaundice causing epigastric to right upper quadrant pain Symptomatic  cholelithiasis without clinical or radiographic evidence of cholecystitis History of asymptomatic ventricular septal defect, prior cardiology evaluation as noted above  Abnormal GI imaging with MRCP suggesting a distal CBD stricture without visualized choledocholithiasis in addition to marked biliary ductal dilatation with a distended noninflamed gallbladder. Overall, the clinical picture favors this most likely still being the result of choledocholithiasis, although a benign biliary stricture (?  PSC) or malignancy (cholangiocarcinoma) should be considered.  Plan:  ERCP, most likely tomorrow with Dr. Stan Head depending on schedule availability. Rationale for this procedure, explanation of this problem and description of pancreaticobiliary anatomy described. Technical aspects of procedure along with risks and benefits including the likelihood of stone extraction and possible stent placement also reviewed and she was agreeable.  The benefits and risks of the planned procedure(s) were described in detail with the patient or (when appropriate) their health care proxy.  Risks were outlined as including, but not limited to, bleeding, infection, perforation, adverse medication reaction leading to cardiac or pulmonary decompensation, pancreatitis (if ERCP).  The limitation of incomplete mucosal visualization was also discussed.  No guarantees or warranties were given.  Does not need antibiotics at present  Regular diet today, n.p.o. after midnight  Case also discussed with Dr. Mahala Menghini, this patient's hospitalist who arrived to see her as I was finishing up.  Thank you for the courtesy of this consult.  Please contact me with any questions or concerns.  Charlie Pitter III Office: [phone]

## 2024-03-05 NOTE — Progress Notes (Addendum)
 [facility] ROUNDING NOTE Audrey Sanders ZOX:[id number]  DOB: Apr 02, 2001  DOA: 03/04/2024  PCP: Pcp, No  03/05/2024,10:03 AM  LOS: 1 day    Code Status: Full code   from: Home current Dispo: Likely home   23 year old female with past medical history of VSD followed by Dr. Carlena Sax to follow-up in 2 years Presented with abdominal pain went to urgent care on 4/7 and had pain nausea vomiting T. bili was 5.5 with elevated AST ALT in the 1:2 ratio RUQ US showed CBD dilatation no cholecystitis-MRCP showed intrahepatic CBD dilatation --?stricture/narrowing of distal CBD?  Stricture  GI and general surgery consulted   Plan   acute cholecystitis with probable choledocholithiasis ERCP planned for tomorrow-soft diet as per GI-looks overall stable for procedure General Surgery to be involved additionally and may require lap chole later this hospital stay Continue ceftriaxone at this time Dilaudid 0.5 every 2 as needed pain, LR 100 cc/H   muscular VSD Outpatient follow-up with cardiology  Abnormal MRI of breast-enhancing nodule right breast 2.3X 1.5 cm-needs outpatient dedicated breast ultrasound  Discussed with family member present   DVT prophylaxis: SCD  Status is: Inpatient Remains inpatient appropriate because:   Requires further management    Subjective: Awake coherent pleasant no distress no pain no flatus tolerating some diet no fever no chills  Objective + exam Vitals:   03/04/24 2154 03/05/24 0031 03/05/24 0543 03/05/24 0850  BP: 117/70 113/71 112/69 121/75  Pulse: 98 97 79 97  Resp: 18 18 16 18   Temp: 98.1 F (36.7 C) 98.5 F (36.9 C) 98.3 F (36.8 C) 98.2 F (36.8 C)  TempSrc: Axillary Oral Oral Oral  SpO2: 100% 94% 99% 100%   There were no vitals filed for this visit.  Examination: EOMI NCAT looks about stated age no icterus no pallor Pleasant Chest clear no wheeze rales rhonchi S1-S2 no murmur ROM intact No right RUQ tenderness No lower extremity edema  Data Reviewed:  reviewed   CBC    Component Value Date/Time   WBC 7.7 03/05/2024 0136   RBC 4.28 03/05/2024 0136   HGB 13.1 03/05/2024 0136   HGB 11.0 (L) 12/06/2022 0952   HCT 39.0 03/05/2024 0136   HCT 31.4 (L) 12/06/2022 0952   PLT 251 03/05/2024 0136   PLT 289 12/06/2022 0952   MCV 91.1 03/05/2024 0136   MCV 91 12/06/2022 0952   MCH 30.6 03/05/2024 0136   MCHC 33.6 03/05/2024 0136   RDW 12.6 03/05/2024 0136   RDW 11.5 (L) 12/06/2022 0952   LYMPHSABS 2.1 03/05/2024 0136   LYMPHSABS 1.7 08/09/2022 1431   MONOABS 0.7 03/05/2024 0136   EOSABS 0.1 03/05/2024 0136   EOSABS 0.1 08/09/2022 1431   BASOSABS 0.0 03/05/2024 0136   BASOSABS 0.0 08/09/2022 1431      Latest Ref Rng & Units 03/05/2024    1:36 AM 03/04/2024    2:48 PM 03/03/2024    2:07 PM  CMP  Glucose 70 - 99 mg/dL 88  811  914   BUN 6 - 20 mg/dL 7  9  8    Creatinine 0.44 - 1.00 mg/dL 7.82  9.56  2.13   Sodium 135 - 145 mmol/L 142  137  140   Potassium 3.5 - 5.1 mmol/L 3.7  3.4  3.8   Chloride 98 - 111 mmol/L 105  103  104   CO2 22 - 32 mmol/L 22  24  25    Calcium 8.9 - 10.3 mg/dL 8.6  9.3  9.4  Total Protein 6.5 - 8.1 g/dL 7.0  7.2  7.6   Total Bilirubin 0.0 - 1.2 mg/dL 3.2  6.1  5.5   Alkaline Phos 38 - 126 U/L 108  106  109   AST 15 - 41 U/L 267  384  753   ALT 0 - 44 U/L 515  582  692     Scheduled Meds: Continuous Infusions:  cefTRIAXone (ROCEPHIN)  IV Stopped (03/04/24 2255)   lactated ringers 100 mL/hr at 03/05/24 0013    Time 44  Rhetta Mura, MD  [facility]

## 2024-03-05 NOTE — Plan of Care (Signed)

## 2024-03-05 NOTE — Consult Note (Signed)
 Audrey Sanders   History Audrey Sanders MRN # [id number]  Date of Admission: 03/04/2024 Date of Consultation: 03/05/2024 Referring physician: Dr. Rhetta Mura, MD Primary Care Provider: Pcp, No Primary Gastroenterologist: None   Reason for Consultation/Chief Complaint: Obstructive jaundice and gallstones with upper abdominal pain  Subjective  HPI:  23 year old woman with biliary colic from gallstones with elevated transaminases discovered October 2024.  She was advised to follow-up with primary care and get a referral to general surgery.  She does not seem to have a primary care provider and did not know who more how to contact the general surgery so that was never done.  She is had worsening symptoms of postprandial epigastric to right upper quadrant pain for several months and more so in the last couple of weeks. Which at urgent care for this 2 days ago found to have significant elevation of LFTs from the prior values including elevated bilirubin and ultrasound suggesting biliary obstruction with a dilated common bile duct, prompting hospital admission. Audrey Sanders says the pain is improved from what it was at the time of admission, but she has needed regular doses of pain medicine.  She denies chronic nausea or vomiting.  Appetite has been off at times due to the pain and she believes she has lost some weight but unable to specify the amount or duration. Bowel habits are regular and she has not been experiencing diarrhea or rectal bleeding.  No known family history of Crohn's/colitis or liver diseases. General surgery has seen this patient in consultation and is recommending a cholecystectomy after evaluation of her bile duct.  ROS:  Remainder systems negative except as above  All other systems are negative except as noted above in the HPI  Past Medical History Past Medical History:  Diagnosis Date   Gallstones   Ventricular septal defect identified on  echocardiogram 2017-she had postpartum evaluation at [facility] cardiology on 03/09/2023, further details in that Sanders.  Patient was reportedly asymptomatic and no further testing or treatment was recommended 2-year follow-up planned  She delivered her only child by spontaneous vaginal delivery in March 2024  Past Surgical History History reviewed. No pertinent surgical history.  Family History History reviewed. No pertinent family history.  Social History Social History   Socioeconomic History   Marital status: Single    Spouse name: Not on file   Number of children: Not on file   Years of education: Not on file   Highest education level: GED or equivalent  Occupational History   Not on file  Tobacco Use   Smoking status: Never    Passive exposure: Never   Smokeless tobacco: Never  Vaping Use   Vaping status: Never Used  Substance and Sexual Activity   Alcohol use: Never   Drug use: Never   Sexual activity: Yes    Birth control/protection: None  Other Topics Concern   Not on file  Social History Narrative   Not on file   Social Drivers of Health   Financial Resource Strain: Patient Declined (12/06/2022)   Overall Financial Resource Strain (CARDIA)    Difficulty of Paying Living Expenses: Patient declined  Food Insecurity: No Food Insecurity (03/05/2024)   Hunger Vital Sign    Worried About Running Out of Food in the Last Year: Never true    Ran Out of Food in the Last Year: Never true  Transportation Needs: No Transportation Needs (03/05/2024)   PRAPARE - Administrator, Civil Service (Medical): No  Lack of Transportation (Non-Medical): No  Physical Activity: Unknown (12/06/2022)   Exercise Vital Sign    Days of Exercise per Week: 0 days    Minutes of Exercise per Session: Not on file  Stress: No Stress Concern Present (12/06/2022)   [employer] of Occupational Health - Occupational Stress Questionnaire    Feeling of Stress : Only a little  Social  Connections: Socially Isolated (03/05/2024)   Social Connection and Isolation Panel [NHANES]    Frequency of Communication with Friends and Family: Three times a week    Frequency of Social Gatherings with Friends and Family: Three times a week    Attends Religious Services: Never    Active Member of Clubs or Organizations: No    Attends Banker Meetings: Never    Marital Status: Never married    Allergies No Known Allergies  Outpatient Meds Home medications from the H+P and/or nursing med reconciliation reviewed.  Inpatient med list reviewed  _____________________________________________________________________ Objective   Exam:  Current vital signs  Patient Vitals for the past 8 hrs:  BP Temp Temp src Pulse Resp SpO2  03/05/24 0850 121/75 98.2 F (36.8 C) Oral 97 18 100 %  03/05/24 0543 112/69 98.3 F (36.8 C) Oral 79 16 99 %    Intake/Output Summary (Last 24 hours) at 03/05/2024 1024 Last data filed at 03/04/2024 2255 Gross per 24 hour  Intake 1098.27 ml  Output --  Net 1098.27 ml    Physical Exam: Her boyfriend was present in the room for the entire visit  General: this is a well-appearing young patient in no acute distress Eyes: sclera icteric, no redness ENT: oral mucosa moist without lesions, no cervical or supraclavicular lymphadenopathy, normal dentition and oral opening, full neck ROM CV: RRR without murmur, S1/S2, no JVD,, no peripheral edema Resp: clear to auscultation bilaterally, normal RR and effort noted GI: soft, no tenderness, with active bowel sounds. No guarding or palpable organomegaly noted Skin; warm and dry, no rash or jaundice noted difficult to appreciate jaundice with dark skin tone Neuro: awake, alert and oriented x 3. Normal gross motor function and fluent speech.  Labs:     Latest Ref Rng & Units 03/05/2024    1:36 AM 03/04/2024    2:48 PM 03/03/2024    2:07 PM  CBC  WBC 4.0 - 10.5 K/uL 7.7  7.3  6.8   Hemoglobin 12.0 - 15.0  g/dL 60.4  54.0  98.1   Hematocrit 36.0 - 46.0 % 39.0  38.4  38.1   Platelets 150 - 400 K/uL 251  264  245        Latest Ref Rng & Units 03/05/2024    1:36 AM 03/04/2024    2:48 PM 03/03/2024    2:07 PM  CMP  Glucose 70 - 99 mg/dL 88  191  478   BUN 6 - 20 mg/dL 7  9  8    Creatinine 0.44 - 1.00 mg/dL 2.95  6.21  3.08   Sodium 135 - 145 mmol/L 142  137  140   Potassium 3.5 - 5.1 mmol/L 3.7  3.4  3.8   Chloride 98 - 111 mmol/L 105  103  104   CO2 22 - 32 mmol/L 22  24  25    Calcium 8.9 - 10.3 mg/dL 8.6  9.3  9.4   Total Protein 6.5 - 8.1 g/dL 7.0  7.2  7.6   Total Bilirubin 0.0 - 1.2 mg/dL 3.2  6.1  5.5  Alkaline Phos 38 - 126 U/L 108  106  109   AST 15 - 41 U/L 267  384  753   ALT 0 - 44 U/L 515  582  692     Recent Labs  Lab 03/05/24 0136  INR 1.1   Negative pregnancy test 03/03/2024 _________________________________________________________ Radiologic studies: CLINICAL DATA:  Elevated liver function studies.   EXAM: ULTRASOUND ABDOMEN LIMITED RIGHT UPPER QUADRANT   COMPARISON:  Prior study 07/08/2023   FINDINGS: Gallbladder:   Multiple echogenic shadowing gallstones also noted on the prior study. No gallbladder wall thickening, pericholecystic fluid or sonographic Murphy sign to suggest acute cholecystitis.   Common bile duct:   Diameter: Abnormally dilated at 11.7 mm. There is also mild intrahepatic biliary dilatation. Findings suspicious for obstructing common bile duct stone. Recommend MRI abdomen/MRCP further evaluation.   Liver:   Normal echogenicity without focal lesion or biliary dilatation. Portal vein is patent on color Doppler imaging with normal direction of blood flow towards the liver.   Other: No ascites.   IMPRESSION: 1. Cholelithiasis but no sonographic findings for acute cholecystitis. 2. Abnormally dilated common bile duct and mild intrahepatic biliary dilatation. Findings suspicious for obstructing common bile duct stone. Recommend MRI  abdomen/MRCP for further evaluation.     Electronically Signed   By: Rudie Meyer M.D.   On: 03/04/2024 21:40   _________________  CLINICAL DATA:  Jaundice.   EXAM: MRI ABDOMEN WITHOUT AND WITH CONTRAST (INCLUDING MRCP)   TECHNIQUE: Multiplanar multisequence MR imaging of the abdomen was performed both before and after the administration of intravenous contrast. Heavily T2-weighted images of the biliary and pancreatic ducts were obtained, and three-dimensional MRCP images were rendered by post processing.   CONTRAST:  10mL GADAVIST GADOBUTROL 1 MMOL/ML IV SOLN   COMPARISON:  Right upper quadrant sonogram 03/04/2024   FINDINGS: Lower chest: No acute findings.   Hepatobiliary: No enhancing liver lesion identified. Gallstones identified. These measure up to 7 mm. The gallbladder appears distended without wall thickening or pericholecystic fluid/inflammation.   Intrahepatic and common bile duct dilatation. Beak like narrowing of the distal common bile duct is noted without signs of choledocholithiasis.   Pancreas: There is no main duct dilatation. Congenitally absent tail of pancreas. No pancreatic inflammation or mass identified.   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   Adrenals/Urinary Tract: Normal adrenal glands. There is a striated nephrographic appearance of both kidneys. No mass or hydronephrosis identified.   Stomach/Bowel: Visualized portions within the abdomen are unremarkable.   Vascular/Lymphatic: Normal caliber of the abdominal aorta. No signs of abdominal adenopathy. Azygous continuation of the IVC.   Other:  No ascites or focal fluid collections.   Musculoskeletal: Within the right breast there is an enhancing nodule which measures 2.3 by 1.5 cm, image 06/1000. No enhancing bone lesions identified.   IMPRESSION: 1. Intrahepatic and common bile duct dilatation. Beak like narrowing of the distal  common bile duct is noted without signs of choledocholithiasis. 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 2. Cholelithiasis. No signs of acute cholecystitis. 3. Striated nephrographic appearance of both kidneys. Findings are nonspecific but can be seen in the setting of pyelonephritis. Correlate with urinalysis. 4. Enhancing nodule within the right breast measures 2.3 x 1.5 cm. Recommend further evaluation with dedicated breast imaging. 5. 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 6. Azygous continuation of the IVC.     Electronically Signed   By: Veronda Prude.D.  On: 03/05/2024 05:16    ______________________________________________________ Other studies: March 2024 echocardiogram  TTE 12/27/2022 IMPRESSIONS     1. Muscular VSD in 2 o'clock position on short axis, mid to distal septum  4 chamber view. Normal RV size. Marland Kitchen Left ventricular ejection fraction, by  estimation, is 55 to 60%. Left ventricular ejection fraction by 3D volume  is 61 %. The left ventricle has  normal function. The left ventricle has no regional wall motion  abnormalities. Left ventricular diastolic parameters were normal. The  average left ventricular global longitudinal strain is -23.3 %. The global  longitudinal strain is normal.   2. Right ventricular systolic function is normal. The right ventricular  size is normal. There is normal pulmonary artery systolic pressure. The  estimated right ventricular systolic pressure is 18.4 mmHg.   3. The mitral valve is normal in structure. Trivial mitral valve  regurgitation. No evidence of mitral stenosis.   4. The aortic valve is tricuspid. Aortic valve regurgitation is not  visualized. No aortic stenosis is present.   5. The inferior vena cava is normal in size with greater than 50%  respiratory variability, suggesting right atrial pressure of 3 mmHg.   FINDINGS   Left Ventricle: Muscular VSD in 2 o'clock  position on short axis, mid to  distal septum 4 chamber view. Normal RV size. Left ventricular ejection  fraction, by estimation, is 55 to 60%. Left ventricular ejection fraction  by 3D volume is 61 %. The left  ventricle has normal function. The left ventricle has no regional wall  motion abnormalities. The average left ventricular global longitudinal  strain is -23.3 %. The global longitudinal strain is normal. The left  ventricular internal cavity size was  normal in size. There is no left ventricular hypertrophy. Left ventricular  diastolic parameters were normal.   Right Ventricle: The right ventricular size is normal. No increase in  right ventricular wall thickness. Right ventricular systolic function is  normal. There is normal pulmonary artery systolic pressure. The tricuspid  regurgitant velocity is 1.96 m/s, and   with an assumed right atrial pressure of 3 mmHg, the estimated right  ventricular systolic pressure is 18.4 mmHg.   Left Atrium: Left atrial size was normal in size.   Right Atrium: Right atrial size was normal in size.   Pericardium: There is no evidence of pericardial effusion.   Mitral Valve: The mitral valve is normal in structure. Trivial mitral  valve regurgitation. No evidence of mitral valve stenosis.   Tricuspid Valve: The tricuspid valve is normal in structure. Tricuspid  valve regurgitation is mild . No evidence of tricuspid stenosis.   Aortic Valve: The aortic valve is tricuspid. Aortic valve regurgitation is  not visualized. No aortic stenosis is present.   Pulmonic Valve: The pulmonic valve was normal in structure. Pulmonic valve  regurgitation is mild. No evidence of pulmonic stenosis.   Aorta: The aortic root is normal in size and structure.   Venous: The inferior vena cava is normal in size with greater than 50%  respiratory variability, suggesting right atrial pressure of 3 mmHg.   IAS/Shunts: No atrial level shunt detected by color flow  Doppler.    TTE 2017 Veins  Normal systemic venous drainage. Three pulmonary veins seen returning to the  left atrium. Normal pulmonary vein velocity.   Atrium  Normal right atrial size. Normal left atrial size. No evidence of atrial  septal defect.   Atrioventricular Valves  Normal mitral valve. No mitral valve insufficiency.  Normal mitral valve  doppler inflow pattern. Normal tricuspid valve. Trivial tricuspid  insufficiency (physiologic). Right ventricular pressure estimate based on  tricuspid regurgitant jet equals 20 mmHg, plus right atrial pressure.   Left Ventricle  Normal left ventricle structure. Normal left ventricular dimension. Normal  left ventricular systolic function. Normal left ventricular diastolic  function.   Right Ventricle  Normal right ventricle structure. Normal right ventricular dimension. Normal  right ventricular systolic function.  Ventricular Septum  Normal septal curvature. Multiple muscular ventricular septal defects.  Aortic Valve  Normal trileaftlet aortic valve. No aortic valve insufficiency. No aortic  valve stenosis.  Pulmonary Valve  Normal pulmonic valve. No pulmonary valve stenosis. Trivial pulmonary  insufficiency (physiologic).  Conotruncus  Normal conotruncus.  Aorta   Left aortic arch with normal branching pattern. Normal aortic root size.  Unobstructed aortic arch. Normal pulsatile flow in descending aorta.  Ascending aortic velocity normal. Descending aortic velocity normal.  Pulmonary Artery  Normal pulmonary artery branches. No right pulmonary artery stenosis. No left  pulmonary artery stenosis.  Ductus Arteriosus  No patent ductus arteriosus.   Coronary Arteries  Normal coronary artery size and origins.  Fluid  No pericardial effusion.   _______________________________________________________ Assessment & Plan  Impression: Obstructive jaundice causing epigastric to right upper quadrant pain Symptomatic  cholelithiasis without clinical or radiographic evidence of cholecystitis History of asymptomatic ventricular septal defect, prior cardiology evaluation as noted above  Abnormal GI imaging with MRCP suggesting a distal CBD stricture without visualized choledocholithiasis in addition to marked biliary ductal dilatation with a distended noninflamed gallbladder. Overall, the clinical picture favors this most likely still being the result of choledocholithiasis, although a benign biliary stricture (?  PSC) or malignancy (cholangiocarcinoma) should be considered.  Plan:  ERCP, most likely tomorrow with Dr. Stan Head depending on schedule availability. Rationale for this procedure, explanation of this problem and description of pancreaticobiliary anatomy described. Technical aspects of procedure along with risks and benefits including the likelihood of stone extraction and possible stent placement also reviewed and she was agreeable.  The benefits and risks of the planned procedure(s) were described in detail with the patient or (when appropriate) their health care proxy.  Risks were outlined as including, but not limited to, bleeding, infection, perforation, adverse medication reaction leading to cardiac or pulmonary decompensation, pancreatitis (if ERCP).  The limitation of incomplete mucosal visualization was also discussed.  No guarantees or warranties were given.  Does not need antibiotics at present  Regular diet today, n.p.o. after midnight  Case also discussed with Dr. Mahala Menghini, this patient's hospitalist who arrived to see her as I was finishing up.  Thank you for the courtesy of this consult.  Please contact me with any questions or concerns.  Charlie Pitter III Office: [phone]

## 2024-03-05 NOTE — Progress Notes (Signed)
 Subjective: CC: Reports ruq abdominal pain after po intake yesterday w/ associated n/v. Pain resolved last night and has not recurred. Npo currently. Still nauseated. No vomiting. Passing flatus. Last BM 4/7. No hx of similar symptoms in the past.   Objective: Vital signs in last 24 hours: Temp:  [98.1 F (36.7 C)-98.9 F (37.2 C)] 98.2 F (36.8 C) (04/09 0850) Pulse Rate:  [78-103] 97 (04/09 0850) Resp:  [16-18] 18 (04/09 0850) BP: (110-129)/(69-79) 121/75 (04/09 0850) SpO2:  [94 %-100 %] 100 % (04/09 0850) Last BM Date : 03/03/24  Intake/Output from previous day: 04/08 0701 - 04/09 0700 In: 1098.3 [IV Piggyback:1098.3] Out: -  Intake/Output this shift: No intake/output data recorded.  PE: Gen:  Alert, NAD, pleasant Pulm:  Rate and effort normal Abd: Soft, no distension, NT, +BS. Umbilical hernia that is soft and NT Psych: A&Ox3   Lab Results:  Recent Labs    03/04/24 1448 03/05/24 0136  WBC 7.3 7.7  HGB 12.8 13.1  HCT 38.4 39.0  PLT 264 251   BMET Recent Labs    03/04/24 1448 03/05/24 0136  NA 137 142  K 3.4* 3.7  CL 103 105  CO2 24 22  GLUCOSE 124* 88  BUN 9 7  CREATININE 0.91 0.78  CALCIUM 9.3 8.6*   PT/INR Recent Labs    03/05/24 0136  LABPROT 14.3  INR 1.1   CMP     Component Value Date/Time   NA 142 03/05/2024 0136   K 3.7 03/05/2024 0136   CL 105 03/05/2024 0136   CO2 22 03/05/2024 0136   GLUCOSE 88 03/05/2024 0136   BUN 7 03/05/2024 0136   CREATININE 0.78 03/05/2024 0136   CALCIUM 8.6 (L) 03/05/2024 0136   PROT 7.0 03/05/2024 0136   ALBUMIN 3.5 03/05/2024 0136   AST 267 (H) 03/05/2024 0136   ALT 515 (H) 03/05/2024 0136   ALKPHOS 108 03/05/2024 0136   BILITOT 3.2 (H) 03/05/2024 0136   GFRNONAA >60 03/05/2024 0136   Lipase     Component Value Date/Time   LIPASE 25 03/04/2024 1448    Studies/Results: MR ABDOMEN MRCP W WO CONTAST Result Date: 03/05/2024 CLINICAL DATA:  Jaundice. EXAM: MRI ABDOMEN WITHOUT AND WITH  CONTRAST (INCLUDING MRCP) TECHNIQUE: Multiplanar multisequence MR imaging of the abdomen was performed both before and after the administration of intravenous contrast. Heavily T2-weighted images of the biliary and pancreatic ducts were obtained, and three-dimensional MRCP images were rendered by post processing. CONTRAST:  10mL GADAVIST GADOBUTROL 1 MMOL/ML IV SOLN COMPARISON:  Right upper quadrant sonogram 03/04/2024 FINDINGS: Lower chest: No acute findings. Hepatobiliary: No enhancing liver lesion identified. Gallstones identified. These measure up to 7 mm. The gallbladder appears distended without wall thickening or pericholecystic fluid/inflammation. Intrahepatic and common bile duct dilatation. Beak like narrowing of the distal common bile duct is noted without signs of choledocholithiasis. Pancreas: There is no main duct dilatation. Congenitally absent tail of pancreas. No pancreatic inflammation or mass identified. 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 Adrenals/Urinary Tract: Normal adrenal glands. There is a striated nephrographic appearance of both kidneys. No mass or hydronephrosis identified. Stomach/Bowel: Visualized portions within the abdomen are unremarkable. Vascular/Lymphatic: Normal caliber of the abdominal aorta. No signs of abdominal adenopathy. Azygous continuation of the IVC. Other:  No ascites or focal fluid collections. Musculoskeletal: Within the right breast there is an enhancing nodule which measures 2.3 by 1.5 cm, image 06/1000. No enhancing bone lesions identified.  IMPRESSION: 1. Intrahepatic and common bile duct dilatation. Beak like narrowing of the distal common bile duct is noted without signs of choledocholithiasis. 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 2. Cholelithiasis. No signs of acute cholecystitis. 3. Striated nephrographic appearance of both kidneys. Findings are nonspecific but can be seen in the setting of  pyelonephritis. Correlate with urinalysis. 4. Enhancing nodule within the right breast measures 2.3 x 1.5 cm. Recommend further evaluation with dedicated breast imaging. 5. 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 6. Azygous continuation of the IVC. Electronically Signed   By: Signa Kell M.D.   On: 03/05/2024 05:16   MR 3D Recon At Scanner Result Date: 03/05/2024 CLINICAL DATA:  Jaundice. EXAM: MRI ABDOMEN WITHOUT AND WITH CONTRAST (INCLUDING MRCP) TECHNIQUE: Multiplanar multisequence MR imaging of the abdomen was performed both before and after the administration of intravenous contrast. Heavily T2-weighted images of the biliary and pancreatic ducts were obtained, and three-dimensional MRCP images were rendered by post processing. CONTRAST:  10mL GADAVIST GADOBUTROL 1 MMOL/ML IV SOLN COMPARISON:  Right upper quadrant sonogram 03/04/2024 FINDINGS: Lower chest: No acute findings. Hepatobiliary: No enhancing liver lesion identified. Gallstones identified. These measure up to 7 mm. The gallbladder appears distended without wall thickening or pericholecystic fluid/inflammation. Intrahepatic and common bile duct dilatation. Beak like narrowing of the distal common bile duct is noted without signs of choledocholithiasis. Pancreas: There is no main duct dilatation. Congenitally absent tail of pancreas. No pancreatic inflammation or mass identified. 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 Adrenals/Urinary Tract: Normal adrenal glands. There is a striated nephrographic appearance of both kidneys. No mass or hydronephrosis identified. Stomach/Bowel: Visualized portions within the abdomen are unremarkable. Vascular/Lymphatic: Normal caliber of the abdominal aorta. No signs of abdominal adenopathy. Azygous continuation of the IVC. Other:  No ascites or focal fluid collections. Musculoskeletal: Within the right breast there is an enhancing nodule  which measures 2.3 by 1.5 cm, image 06/1000. No enhancing bone lesions identified. IMPRESSION: 1. Intrahepatic and common bile duct dilatation. Beak like narrowing of the distal common bile duct is noted without signs of choledocholithiasis. 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 2. Cholelithiasis. No signs of acute cholecystitis. 3. Striated nephrographic appearance of both kidneys. Findings are nonspecific but can be seen in the setting of pyelonephritis. Correlate with urinalysis. 4. Enhancing nodule within the right breast measures 2.3 x 1.5 cm. Recommend further evaluation with dedicated breast imaging. 5. 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 6. Azygous continuation of the IVC. Electronically Signed   By: Signa Kell M.D.   On: 03/05/2024 05:16   US Abdomen Limited RUQ (LIVER/GB) Result Date: 03/04/2024 CLINICAL DATA:  Elevated liver function studies. EXAM: ULTRASOUND ABDOMEN LIMITED RIGHT UPPER QUADRANT COMPARISON:  Prior study 07/08/2023 FINDINGS: Gallbladder: Multiple echogenic shadowing gallstones also noted on the prior study. No gallbladder wall thickening, pericholecystic fluid or sonographic Murphy sign to suggest acute cholecystitis. Common bile duct: Diameter: Abnormally dilated at 11.7 mm. There is also mild intrahepatic biliary dilatation. Findings suspicious for obstructing common bile duct stone. Recommend MRI abdomen/MRCP further evaluation. Liver: Normal echogenicity without focal lesion or biliary dilatation. Portal vein is patent on color Doppler imaging with normal direction of blood flow towards the liver. Other: No ascites. IMPRESSION: 1. Cholelithiasis but no sonographic findings for acute cholecystitis. 2. Abnormally dilated common bile duct and mild intrahepatic biliary dilatation. Findings suspicious for obstructing common bile duct stone. Recommend MRI abdomen/MRCP for further  evaluation. Electronically Signed   By: Rudie Meyer M.D.    On: 03/04/2024 21:40    Anti-infectives: Anti-infectives (From admission, onward)    Start     Dose/Rate Route Frequency Ordered Stop   03/04/24 2200  cefTRIAXone (ROCEPHIN) 2 g in sodium chloride 0.9 % 100 mL IVPB        2 g 200 mL/hr over 30 Minutes Intravenous Every 24 hours 03/04/24 2150          Assessment/Plan Cholelithiasis w/ elevated LFT's - RUQ [place] and MRCP w/ cholelithiasis without evidence of cholecystitis. WBC wnl - MRCP w/ intra-hepatic and CBD dilatation w/o choledocholithiasis but possible stricture.  - LFT's elevated on admission, improving today. Alk Phos 108 (106), AST 267 (384), ALT 515 (582), T. Bili 3.2 (6.1). Lipase wnl.  - Pain resolved. NT on exam. Possible she passed a gallstone. Still would recommend touching base w/ GI to see their thoughts on possible CBD stricture and if they feels she needs further w/u for this before lap chole. Appears they are already on board.  - Pending GI recs, plan likely laparoscopic cholecystectomy w/ possible IOC during admission.  I have explained the procedure, risks, and aftercare of Laparoscopic cholecystectomy  with IOC.  Risks include but are not limited to anesthesia (MI, CVA, death, prolonged intubation and aspiration), bleeding, infection, wound problems, hernia, bile leak, injury to common bile duct/liver/intestine, possible need for subtotal cholecystectomy or open cholecystectomy, increased risk of DVT/PE and diarrhea post op.  She seems to understand and agrees to proceed.  FEN - NPO, IVF per primary  VTE - SCDs, okay for chem ppx from a general surgery standpoint ID - Rocephin per primary   Incidental findings - Splenules. Azygous continuation of the IVC. R breast nodule - recommend outpt follow up for dedicated breast imaging    I reviewed nursing notes, last 24 h vitals and pain scores, last 48 h intake and output, last 24 h labs and trends, and last 24 h imaging results.   LOS: 1 day    Jacinto Halim,  [facility] Surgery 03/05/2024, 10:02 AM Please see Amion for pager number during day hours 7:00am-4:30pm

## 2024-03-06 ENCOUNTER — Inpatient Hospital Stay (HOSPITAL_COMMUNITY): Admitting: Anesthesiology

## 2024-03-06 ENCOUNTER — Inpatient Hospital Stay (HOSPITAL_COMMUNITY)

## 2024-03-06 ENCOUNTER — Encounter (HOSPITAL_COMMUNITY)
Admission: EM | Disposition: A | Discharge status: Home / Self Care | Payer: Self-pay | Source: Home / Self Care | Attending: Family Medicine

## 2024-03-06 ENCOUNTER — Encounter (HOSPITAL_COMMUNITY): Payer: Self-pay | Admitting: Internal Medicine

## 2024-03-06 DIAGNOSIS — K838 Other specified diseases of biliary tract: Secondary | ICD-10-CM

## 2024-03-06 DIAGNOSIS — K3189 Other diseases of stomach and duodenum: Secondary | ICD-10-CM

## 2024-03-06 DIAGNOSIS — K810 Acute cholecystitis: Secondary | ICD-10-CM | POA: Diagnosis not present

## 2024-03-06 HISTORY — PX: ERCP: SHX5425

## 2024-03-06 HISTORY — PX: BILIARY BRUSHING: SHX6843

## 2024-03-06 LAB — COMPREHENSIVE METABOLIC PANEL WITH GFR
ALT: 383 U/L — ABNORMAL HIGH (ref 0–44)
AST: 213 U/L — ABNORMAL HIGH (ref 15–41)
Albumin: 3.1 g/dL — ABNORMAL LOW (ref 3.5–5.0)
Alkaline Phosphatase: 108 U/L (ref 38–126)
Anion gap: 14 (ref 5–15)
BUN: <5 mg/dL — ABNORMAL LOW (ref 6–20)
CO2: 22 mmol/L (ref 22–32)
Calcium: 9 mg/dL (ref 8.9–10.3)
Chloride: 102 mmol/L (ref 98–111)
Creatinine, Ser: 0.76 mg/dL (ref 0.44–1.00)
GFR, Estimated: >60 mL/min (ref >60)
Glucose, Bld: 72 mg/dL (ref 70–99)
Potassium: 3.5 mmol/L (ref 3.5–5.1)
Sodium: 138 mmol/L (ref 135–145)
Total Bilirubin: 5.3 mg/dL — ABNORMAL HIGH (ref 0.0–1.2)
Total Protein: 6.4 g/dL — ABNORMAL LOW (ref 6.5–8.1)

## 2024-03-06 LAB — CBC
HCT: 34 % — ABNORMAL LOW (ref 36.0–46.0)
Hemoglobin: 11.4 g/dL — ABNORMAL LOW (ref 12.0–15.0)
MCH: 31 pg (ref 26.0–34.0)
MCHC: 33.5 g/dL (ref 30.0–36.0)
MCV: 92.4 fL (ref 80.0–100.0)
Platelets: 234 10*3/uL (ref 150–400)
RBC: 3.68 MIL/uL — ABNORMAL LOW (ref 3.87–5.11)
RDW: 12.5 % (ref 11.5–15.5)
WBC: 6.5 10*3/uL (ref 4.0–10.5)
nRBC: 0 % (ref 0.0–0.2)

## 2024-03-06 SURGERY — ERCP, WITH INTERVENTION IF INDICATED
Anesthesia: General

## 2024-03-06 MED ORDER — DEXAMETHASONE SODIUM PHOSPHATE 10 MG/ML IJ SOLN
INTRAMUSCULAR | Status: DC | PRN
  Administered 2024-03-06: 10 mg via INTRAVENOUS

## 2024-03-06 MED ORDER — LIDOCAINE 2% (20 MG/ML) 5 ML SYRINGE
INTRAMUSCULAR | Status: DC | PRN
  Administered 2024-03-06: 60 mg via INTRAVENOUS

## 2024-03-06 MED ORDER — FENTANYL CITRATE (PF) 250 MCG/5ML IJ SOLN
INTRAMUSCULAR | Status: DC | PRN
  Administered 2024-03-06: 100 ug via INTRAVENOUS

## 2024-03-06 MED ORDER — PROPOFOL 10 MG/ML IV BOLUS
INTRAVENOUS | Status: DC | PRN
  Administered 2024-03-06: 100 mg via INTRAVENOUS
  Administered 2024-03-06: 200 mg via INTRAVENOUS

## 2024-03-06 MED ORDER — CIPROFLOXACIN IN D5W 400 MG/200ML IV SOLN
INTRAVENOUS | Status: AC
Start: 2024-03-06 — End: ?
  Filled 2024-03-06: qty 200

## 2024-03-06 MED ORDER — DEXMEDETOMIDINE HCL IN NACL 80 MCG/20ML IV SOLN
INTRAVENOUS | Status: DC | PRN
  Administered 2024-03-06: 12 ug via INTRAVENOUS

## 2024-03-06 MED ORDER — FENTANYL CITRATE (PF) 100 MCG/2ML IJ SOLN
INTRAMUSCULAR | Status: AC
  Filled 2024-03-06: qty 2

## 2024-03-06 MED ORDER — SODIUM CHLORIDE 0.9 % IV SOLN
INTRAVENOUS | Status: AC
  Filled 2024-03-06: qty 8

## 2024-03-06 MED ORDER — ROCURONIUM BROMIDE 10 MG/ML (PF) SYRINGE
PREFILLED_SYRINGE | INTRAVENOUS | Status: DC | PRN
  Administered 2024-03-06: 80 mg via INTRAVENOUS

## 2024-03-06 MED ORDER — DICLOFENAC SUPPOSITORY 100 MG
RECTAL | Status: DC | PRN
  Administered 2024-03-06: 100 mg via RECTAL

## 2024-03-06 MED ORDER — MIDAZOLAM HCL 2 MG/2ML IJ SOLN
INTRAMUSCULAR | Status: AC
  Filled 2024-03-06: qty 2

## 2024-03-06 MED ORDER — SUCCINYLCHOLINE CHLORIDE 200 MG/10ML IV SOSY
PREFILLED_SYRINGE | INTRAVENOUS | Status: DC | PRN
  Administered 2024-03-06: 100 mg via INTRAVENOUS

## 2024-03-06 MED ORDER — INDOCYANINE GREEN 25 MG IV SOLR
1.2500 mg | INTRAVENOUS | Status: DC
  Filled 2024-03-06: qty 10

## 2024-03-06 MED ORDER — GLUCAGON HCL RDNA (DIAGNOSTIC) 1 MG IJ SOLR
INTRAMUSCULAR | Status: AC
  Filled 2024-03-06: qty 1

## 2024-03-06 MED ORDER — SODIUM CHLORIDE 0.9 % IV SOLN
INTRAVENOUS | Status: DC | PRN
  Administered 2024-03-06: 50 mL

## 2024-03-06 MED ORDER — SODIUM CHLORIDE 0.9 % IV SOLN: 3.0000 g | Freq: Once | INTRAVENOUS | Status: DC

## 2024-03-06 MED ORDER — ONDANSETRON HCL 4 MG/2ML IJ SOLN
INTRAMUSCULAR | Status: DC | PRN
  Administered 2024-03-06: 4 mg via INTRAVENOUS

## 2024-03-06 MED ORDER — DICLOFENAC SUPPOSITORY 100 MG
RECTAL | Status: AC
  Filled 2024-03-06: qty 1

## 2024-03-06 MED ORDER — MIDAZOLAM HCL 2 MG/2ML IJ SOLN
INTRAMUSCULAR | Status: DC | PRN
  Administered 2024-03-06: 2 mg via INTRAVENOUS

## 2024-03-06 MED ORDER — PHENYLEPHRINE 80 MCG/ML (10ML) SYRINGE FOR IV PUSH (FOR BLOOD PRESSURE SUPPORT)
PREFILLED_SYRINGE | INTRAVENOUS | Status: DC | PRN
  Administered 2024-03-06: 80 ug via INTRAVENOUS
  Administered 2024-03-06: 160 ug via INTRAVENOUS
  Administered 2024-03-06: 80 ug via INTRAVENOUS
  Administered 2024-03-06: 160 ug via INTRAVENOUS

## 2024-03-06 MED ORDER — SUGAMMADEX SODIUM 200 MG/2ML IV SOLN
INTRAVENOUS | Status: DC | PRN
  Administered 2024-03-06: 200 mg via INTRAVENOUS

## 2024-03-06 NOTE — Op Note (Signed)
 [facility] Patient Name: Audrey Sanders Procedure Date : 03/06/2024 MRN: [id number] Attending MD: Iva Boop , MD, [id number] Date of Birth: Sep 05, 2001 CSN: [id number] Age: 23 Admit Type: Inpatient Procedure:                ERCP Indications:              Abnormal MRCP, Jaundice with dilated bile duct with                            "beak like narrowing" in the distal bile duct.                            Cholelithiasis also. Providers:                Iva Boop, MD, Lorenza Evangelist, RN, Rozetta Nunnery, Technician Referring MD:              Medicines:                General Anesthesia, The patient had received IV                            ceftriaxone within the past 24 hours and diclofenac                            100 mg per rectum was administered. Complications:            No immediate complications. Estimated Blood Loss:     Estimated blood loss: none. Procedure:                Pre-Anesthesia Assessment:                           - Prior to the procedure, a History and Physical                            was performed, and patient medications and                            allergies were reviewed. The patient's tolerance of                            previous anesthesia was also reviewed. The risks                            and benefits of the procedure and the sedation                            options and risks were discussed with the patient.                            All questions were answered, and informed consent  was obtained. Prior Anticoagulants: The patient has                            taken no anticoagulant or antiplatelet agents. ASA                            Grade Assessment: I - A normal, healthy patient.                            After reviewing the risks and benefits, the patient                            was deemed in satisfactory condition to undergo the                             procedure.                           After obtaining informed consent, the scope was                            passed under direct vision. Throughout the                            procedure, the patient's blood pressure, pulse, and                            oxygen saturations were monitored continuously. The                            [employer] D single use                            duodenoscope was introduced through the mouth, and                            used to inject contrast into and used to inject                            contrast into the bile duct. The ERCP was performed                            with difficulty due to unusual anatomy. The patient                            tolerated the procedure well.                           The esophagus was not seen well with the                            side-viewing scope. The stomach was grossly normal  but was J-shaped. It was difficult to locate the                            papilla which was more proximal than typical, and                            that the angulation of the duodenal sweep. With                            some difficulty I was able to position the scope in                            the short position but it would frequently slip                            back into the stomach. I was able to deeply                            cannulate the bile duct with a Hydratome                            sphincterotome and 035 wire. The pancreas was not                            entered at all. Contrast injection revealed a                            dilated bile duct and biliary tree diffusely.                            Images interpreted by me. Maximum diameter                            approximately 13 mm. The distal bile duct seemed a                            bit more narrow with a distinct change in caliber                            to a more dilated duct  more proximately as seen on                            the MRCP also, but there was no focal stricture                            noted. This disappeared or resolved after the                            biliary sphincterotomy was performed. I did not                            identify any  stones with injection of contrast. To                            discover objects a retrieval balloon was passed and                            the ducts were swept but no stones were seen in                            occlusion cholangiography did not reveal stones                            either. The gallbladder did not fill. It became                            increasingly difficult to maintain the position                            with the scope. Copious amounts of dark bile drain                            throughout which also made it difficult to                            visualize the papilla at times due to the abnormal                            anatomy and location of the papilla. I did not see                            any purulent material. I did take brushings of the                            very distal bile duct and papilla given the concern                            for stricture raised on MRCP. I think it is quite                            possible that she had somewhat of a redundant bile                            duct that was distorted a bit suggesting a                            stricture. 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  Scope In: Scope Out: Findings:      The scout film was normal. Impression:               - The entire main bile duct was moderately dilated,                            acquired.                           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 No                            evidence of discrete stricture, I think the anatomy                            of the distal bile duct was distorted with an                            abrupt difference in caliber in the distal duct                            which resolved or disappeared once biliary                            sphincterotomy was performed.                           -Unusual location of papilla and J-shaped stomach                            making procedure difficult as described above. Recommendation:           - Clear liquids only today n.p.o. after midnight                           Anticipate laparoscopic cholecystectomy tomorrow.                            If possible would do intraoperative cholangiogram.                           Follow-up LFTs in the morning and after                            cholecystectomy.                           Follow-up cytologic brushings. Procedure Code(s):        --- Professional ---  [phone], Endoscopic retrograde                            cholangiopancreatography (ERCP); with removal of                            calculi/debris from biliary/pancreatic duct(s)                           43262, Endoscopic retrograde                            cholangiopancreatography (ERCP); with                            sphincterotomy/papillotomy                           [phone], Endoscopic catheterization of the biliary                            ductal system, radiological supervision and                            interpretation Diagnosis Code(s):        --- Professional ---                           R17, Unspecified jaundice                           K83.8, Other specified diseases of biliary tract                           R93.2, Abnormal findings on diagnostic imaging of                            liver and biliary tract CPT copyright 2022 American  Medical Association. All rights reserved. The codes documented in this report are preliminary and upon coder review may  be revised to meet current compliance requirements. Iva Boop, MD 03/06/2024 3:44:23 PM This report has been signed electronically. Number of Addenda: 0

## 2024-03-06 NOTE — Interval H&P Note (Signed)
 History and Physical Interval Note:  03/06/2024 1:14 PM  Audrey Sanders  has presented today for surgery, with the diagnosis of Obstructive jaundice.  The various methods of treatment have been discussed with the patient and family. After consideration of risks, benefits and other options for treatment, the patient has consented to  Procedure(s): ERCP, WITH INTERVENTION IF INDICATED (N/A) as a surgical intervention.  The patient's history has been reviewed, patient examined, no change in status, stable for surgery.  I have reviewed the patient's chart and labs.  Questions were answered to the patient's satisfaction.    The risks and benefits as well as alternatives of endoscopic procedure(s) have been discussed and reviewed. Bleeding, injury, pancreatitis, reactions to anesthesia, need for surgery discussed. All questions answered. The patient agrees to proceed.   Stan Head

## 2024-03-06 NOTE — Anesthesia Procedure Notes (Signed)
 Procedure Name: Intubation Date/Time: 03/06/2024 2:08 PM  Performed by: Orlin Hilding, CRNAPre-anesthesia Checklist: Patient identified, Emergency Drugs available, Suction available, Patient being monitored and Timeout performed Patient Re-evaluated:Patient Re-evaluated prior to induction Oxygen Delivery Method: Circle system utilized Preoxygenation: Pre-oxygenation with 100% oxygen Induction Type: IV induction and Rapid sequence Laryngoscope Size: Mac and 3 Grade View: Grade I Tube type: Oral Tube size: 7.0 mm Number of attempts: 1 Placement Confirmation: ETT inserted through vocal cords under direct vision, positive ETCO2 and breath sounds checked- equal and bilateral Secured at: 21 cm Tube secured with: Tape Dental Injury: Teeth and Oropharynx as per pre-operative assessment

## 2024-03-06 NOTE — TOC Initial Note (Signed)
 Transition of Care (TOC) - Initial/Assessment Note   Spoke to patient at bedside. Patient from home with her boyfriend and his mother.   Prior to admission patient independent . No DME.   Patient sees Dr Shawnie Pons for OB/GYN.   She does not have a PCP .   Explained patient can call number on medicaid card and be provided a list of providers in network. Patient voiced understanding and will arrange a PCP  Patient Details  Name: Audrey Sanders MRN: [id number] Date of Birth: December 30, 2000  Transition of Care [facility]) CM/SW Contact:    Kingsley Plan, RN Phone Number: 03/06/2024, 10:49 AM  Clinical Narrative:                   Expected Discharge Plan: Home/Self Care Barriers to Discharge: Continued Medical Work up   Patient Goals and CMS Choice Patient states their goals for this hospitalization and ongoing recovery are:: to return to home          Expected Discharge Plan and Services In-house Referral: NA Discharge Planning Services: CM Consult Post Acute Care Choice: NA                   DME Arranged: N/A DME Agency: NA       HH Arranged: NA HH Agency: NA        Prior Living Arrangements/Services   Lives with:: Significant Other Patient language and need for interpreter reviewed:: Yes Do you feel safe going back to the place where you live?: Yes      Need for Family Participation in Patient Care: Yes (Comment) Care giver support system in place?: Yes (comment)   Criminal Activity/Legal Involvement Pertinent to Current Situation/Hospitalization: No - Comment as needed  Activities of Daily Living   ADL Screening (condition at time of admission) Independently performs ADLs?: Yes (appropriate for developmental age) Is the patient deaf or have difficulty hearing?: No Does the patient have difficulty seeing, even when wearing glasses/contacts?: Yes Does the patient have difficulty concentrating, remembering, or making decisions?: No  Permission  Sought/Granted   Permission granted to share information with : No              Emotional Assessment Appearance:: Appears stated age Attitude/Demeanor/Rapport: Engaged Affect (typically observed): Appropriate Orientation: : Oriented to Self, Oriented to Place, Oriented to  Time, Oriented to Situation Alcohol / Substance Use: Not Applicable Psych Involvement: No (comment)  Admission diagnosis:  Acute cholecystitis [K81.0] Calculus of bile duct without cholecystitis with obstruction [K80.51] Patient Active Problem List   Diagnosis Date Noted   Right upper quadrant pain 03/05/2024   Nausea 03/05/2024   Transaminitis 03/05/2024   Hypokalemia 03/05/2024   Calculus of bile duct without cholecystitis with obstruction 03/05/2024   Elevated LFTs 03/05/2024   Obstructive jaundice 03/05/2024   Abnormal finding on GI tract imaging 03/05/2024   Acute cholecystitis 03/04/2024   History of gestational diabetes 03/22/2023   VSD (ventricular septal defect), muscular 07/12/2016   PCP:  Pcp, No Pharmacy:   [facility] - [place], [place] - [address] [place] [address] Phone: [phone] Fax: [phone]  [facility] - [place], [place] - [address] [place] [address] Phone: [phone] Fax: [phone]  [facility] [address] [place] [address] Phone: [phone] Fax: [phone]     Social Drivers of Health (SDOH) Social History: SDOH Screenings  Food Insecurity: No Food Insecurity (03/05/2024)  Housing: Low Risk  (03/05/2024)  Transportation Needs: No Transportation Needs (03/05/2024)  Utilities: Not At Risk (03/05/2024)  Depression (PHQ2-9): Low Risk  (01/24/2023)  Financial Resource Strain: Patient Declined (12/06/2022)  Physical Activity: Unknown (12/06/2022)  Social Connections: Socially Isolated (03/05/2024)  Stress: No Stress Concern Present  (12/06/2022)  Tobacco Use: Low Risk  (03/04/2024)   SDOH Interventions:     Readmission Risk Interventions     No data to display

## 2024-03-06 NOTE — Progress Notes (Signed)
 * Day of Surgery *  Subjective: CC: RUQ abdominal pain, n/v returned after po intake yesterday.   Objective: Vital signs in last 24 hours: Temp:  [98.1 F (36.7 C)-98.5 F (36.9 C)] 98.5 F (36.9 C) (04/09 2111) Pulse Rate:  [64-97] 64 (04/09 2111) Resp:  [16-18] 16 (04/09 2111) BP: (102-131)/(63-77) 131/73 (04/09 2111) SpO2:  [100 %] 100 % (04/09 2111) Weight:  [54.5 kg] 54.5 kg (04/10 0500) Last BM Date : 03/03/24  Intake/Output from previous day: 04/09 0701 - 04/10 0700 In: 1702.8 [P.O.:240; I.V.:1462.8] Out: -  Intake/Output this shift: No intake/output data recorded.  PE: Gen:  Alert, NAD, pleasant Pulm:  Rate and effort normal Abd: Soft, no distension, RUQ ttp, +BS. Umbilical hernia that is soft and NT Psych: A&Ox3   Lab Results:  Recent Labs    03/05/24 0136 03/06/24 0337  WBC 7.7 6.5  HGB 13.1 11.4*  HCT 39.0 34.0*  PLT 251 234   BMET Recent Labs    03/05/24 0136 03/06/24 0337  NA 142 138  K 3.7 3.5  CL 105 102  CO2 22 22  GLUCOSE 88 72  BUN 7 <5*  CREATININE 0.78 0.76  CALCIUM 8.6* 9.0   PT/INR Recent Labs    03/05/24 0136  LABPROT 14.3  INR 1.1   CMP     Component Value Date/Time   NA 138 03/06/2024 0337   K 3.5 03/06/2024 0337   CL 102 03/06/2024 0337   CO2 22 03/06/2024 0337   GLUCOSE 72 03/06/2024 0337   BUN <5 (L) 03/06/2024 0337   CREATININE 0.76 03/06/2024 0337   CALCIUM 9.0 03/06/2024 0337   PROT 6.4 (L) 03/06/2024 0337   ALBUMIN 3.1 (L) 03/06/2024 0337   AST 213 (H) 03/06/2024 0337   ALT 383 (H) 03/06/2024 0337   ALKPHOS 108 03/06/2024 0337   BILITOT 5.3 (H) 03/06/2024 0337   GFRNONAA >60 03/06/2024 0337   Lipase     Component Value Date/Time   LIPASE 25 03/04/2024 1448    Studies/Results: MR ABDOMEN MRCP W WO CONTAST Result Date: 03/05/2024 CLINICAL DATA:  Jaundice. EXAM: MRI ABDOMEN WITHOUT AND WITH CONTRAST (INCLUDING MRCP) TECHNIQUE: Multiplanar multisequence MR imaging of the abdomen was performed  both before and after the administration of intravenous contrast. Heavily T2-weighted images of the biliary and pancreatic ducts were obtained, and three-dimensional MRCP images were rendered by post processing. CONTRAST:  10mL GADAVIST GADOBUTROL 1 MMOL/ML IV SOLN COMPARISON:  Right upper quadrant sonogram 03/04/2024 FINDINGS: Lower chest: No acute findings. Hepatobiliary: No enhancing liver lesion identified. Gallstones identified. These measure up to 7 mm. The gallbladder appears distended without wall thickening or pericholecystic fluid/inflammation. Intrahepatic and common bile duct dilatation. Beak like narrowing of the distal common bile duct is noted without signs of choledocholithiasis. Pancreas: There is no main duct dilatation. Congenitally absent tail of pancreas. No pancreatic inflammation or mass identified. 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 Adrenals/Urinary Tract: Normal adrenal glands. There is a striated nephrographic appearance of both kidneys. No mass or hydronephrosis identified. Stomach/Bowel: Visualized portions within the abdomen are unremarkable. Vascular/Lymphatic: Normal caliber of the abdominal aorta. No signs of abdominal adenopathy. Azygous continuation of the IVC. Other:  No ascites or focal fluid collections. Musculoskeletal: Within the right breast there is an enhancing nodule which measures 2.3 by 1.5 cm, image 06/1000. No enhancing bone lesions identified. IMPRESSION: 1. Intrahepatic and common bile duct dilatation. Beak like narrowing of the distal common bile  duct is noted without signs of choledocholithiasis. 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 2. Cholelithiasis. No signs of acute cholecystitis. 3. Striated nephrographic appearance of both kidneys. Findings are nonspecific but can be seen in the setting of pyelonephritis. Correlate with urinalysis. 4. Enhancing nodule within the right breast measures 2.3 x  1.5 cm. Recommend further evaluation with dedicated breast imaging. 5. 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 6. Azygous continuation of the IVC. Electronically Signed   By: Signa Kell M.D.   On: 03/05/2024 05:16   MR 3D Recon At Scanner Result Date: 03/05/2024 CLINICAL DATA:  Jaundice. EXAM: MRI ABDOMEN WITHOUT AND WITH CONTRAST (INCLUDING MRCP) TECHNIQUE: Multiplanar multisequence MR imaging of the abdomen was performed both before and after the administration of intravenous contrast. Heavily T2-weighted images of the biliary and pancreatic ducts were obtained, and three-dimensional MRCP images were rendered by post processing. CONTRAST:  10mL GADAVIST GADOBUTROL 1 MMOL/ML IV SOLN COMPARISON:  Right upper quadrant sonogram 03/04/2024 FINDINGS: Lower chest: No acute findings. Hepatobiliary: No enhancing liver lesion identified. Gallstones identified. These measure up to 7 mm. The gallbladder appears distended without wall thickening or pericholecystic fluid/inflammation. Intrahepatic and common bile duct dilatation. Beak like narrowing of the distal common bile duct is noted without signs of choledocholithiasis. Pancreas: There is no main duct dilatation. Congenitally absent tail of pancreas. No pancreatic inflammation or mass identified. 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 Adrenals/Urinary Tract: Normal adrenal glands. There is a striated nephrographic appearance of both kidneys. No mass or hydronephrosis identified. Stomach/Bowel: Visualized portions within the abdomen are unremarkable. Vascular/Lymphatic: Normal caliber of the abdominal aorta. No signs of abdominal adenopathy. Azygous continuation of the IVC. Other:  No ascites or focal fluid collections. Musculoskeletal: Within the right breast there is an enhancing nodule which measures 2.3 by 1.5 cm, image 06/1000. No enhancing bone lesions identified. IMPRESSION: 1.  Intrahepatic and common bile duct dilatation. Beak like narrowing of the distal common bile duct is noted without signs of choledocholithiasis. 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 2. Cholelithiasis. No signs of acute cholecystitis. 3. Striated nephrographic appearance of both kidneys. Findings are nonspecific but can be seen in the setting of pyelonephritis. Correlate with urinalysis. 4. Enhancing nodule within the right breast measures 2.3 x 1.5 cm. Recommend further evaluation with dedicated breast imaging. 5. 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 6. Azygous continuation of the IVC. Electronically Signed   By: Signa Kell M.D.   On: 03/05/2024 05:16   US Abdomen Limited RUQ (LIVER/GB) Result Date: 03/04/2024 CLINICAL DATA:  Elevated liver function studies. EXAM: ULTRASOUND ABDOMEN LIMITED RIGHT UPPER QUADRANT COMPARISON:  Prior study 07/08/2023 FINDINGS: Gallbladder: Multiple echogenic shadowing gallstones also noted on the prior study. No gallbladder wall thickening, pericholecystic fluid or sonographic Murphy sign to suggest acute cholecystitis. Common bile duct: Diameter: Abnormally dilated at 11.7 mm. There is also mild intrahepatic biliary dilatation. Findings suspicious for obstructing common bile duct stone. Recommend MRI abdomen/MRCP further evaluation. Liver: Normal echogenicity without focal lesion or biliary dilatation. Portal vein is patent on color Doppler imaging with normal direction of blood flow towards the liver. Other: No ascites. IMPRESSION: 1. Cholelithiasis but no sonographic findings for acute cholecystitis. 2. Abnormally dilated common bile duct and mild intrahepatic biliary dilatation. Findings suspicious for obstructing common bile duct stone. Recommend MRI abdomen/MRCP for further evaluation. Electronically Signed   By: Rudie Meyer M.D.   On: 03/04/2024 21:40  Anti-infectives: Anti-infectives (From admission, onward)     Start     Dose/Rate Route Frequency Ordered Stop   03/04/24 2200  cefTRIAXone (ROCEPHIN) 2 g in sodium chloride 0.9 % 100 mL IVPB        2 g 200 mL/hr over 30 Minutes Intravenous Every 24 hours 03/04/24 2150          Assessment/Plan Cholelithiasis w/ elevated LFT's - RUQ [place] and MRCP w/ cholelithiasis without evidence of cholecystitis. WBC wnl - MRCP w/ intra-hepatic and CBD dilatation w/o choledocholithiasis but possible stricture.  - LFT's elevated with uptrending T. Bili to 5.3 today. GI planning ERCP today.  - Tentatively plan Lap Chole tomorrow - Repeat labs in AM  FEN - NPO, okay for liquids after ercp. NPO at midnight. IVF per primary  VTE - SCDs, okay for chem ppx from a general surgery standpoint ID - Rocephin per primary   Incidental findings - Splenules. Azygous continuation of the IVC. R breast nodule - recommend outpt follow up for dedicated breast imaging   I reviewed nursing notes, last 24 h vitals and pain scores, last 48 h intake and output, last 24 h labs and trends, and last 24 h imaging results.   LOS: 2 days    Jacinto Halim, [facility] 03/06/2024, 8:50 AM Please see Amion for pager number during day hours 7:00am-4:30pm

## 2024-03-06 NOTE — Transfer of Care (Signed)
 Immediate Anesthesia Transfer of Care Note  Patient: Audrey Sanders  Procedure(s) Performed: ERCP, WITH INTERVENTION IF INDICATED BRUSH BIOPSY, BILE DUCT  Patient Location: PACU and Endoscopy Unit  Anesthesia Type:General  Level of Consciousness: drowsy and patient cooperative  Airway & Oxygen Therapy: Patient Spontanous Breathing and Patient connected to face mask oxygen  Post-op Assessment: Report given to RN and Post -op Vital signs reviewed and stable  Post vital signs: Reviewed and stable  Last Vitals:  Vitals Value Taken Time  BP 126/79 03/06/24 1557  Temp 36.6 C 03/06/24 1557  Pulse 99 03/06/24 1559  Resp 18 03/06/24 1559  SpO2 100 % 03/06/24 1559  Vitals shown include unfiled device data.  Last Pain:  Vitals:   03/06/24 1557  TempSrc: Temporal  PainSc: 0-No pain         Complications: No notable events documented.

## 2024-03-06 NOTE — Progress Notes (Signed)
 TRH ROUNDING NOTE Audrey Sanders:[id number]  DOB: [birth year]  DOA: 03/04/2024  PCP: Pcp, No  03/06/2024,10:14 AM  LOS: 2 days    Code Status: Full code   from: Home current Dispo: Likely home   23 year old female with past medical history of VSD followed by Dr. Carlena Sax to follow-up in 2 years Presented with abdominal pain went to urgent care on 4/7 and had pain nausea vomiting T. bili was 5.5 with elevated AST ALT in the 1:2 ratio RUQ US showed CBD dilatation no cholecystitis-MRCP showed intrahepatic CBD dilatation --?stricture/narrowing of distal CBD?  Stricture  GI and general surgery consulted   Plan   acute cholecystitis with probable choledocholithiasis versus stricture based on MRCP N.p.o. for ERCP.today had some nausea today placed on Zofran 4 mg every 6 as needed GI does not feel he needs antibiotics we will discontinue pain control as he required as needed every 2 as needed can use Tylenol as needed Saline 75 cc/H till procedure Probable lap chole 4/11   muscular VSD Outpatient follow-up with cardiology  Abnormal MRI of breast-enhancing nodule right breast 2.3X 1.5 cm-PCP to arrange outpatient dedicated breast ultrasound   DVT prophylaxis: SCD  Status is: Inpatient Remains inpatient appropriate because:   Requires further management    Subjective:  Nausea ++ No fever no chills N.p.o. for procedure looks a little bit sleepy  Objective + exam Vitals:   03/05/24 1500 03/05/24 2111 03/06/24 0500 03/06/24 0854  BP: 131/77 131/73  114/67  Pulse: 66 64  75  Resp: 18 16  16   Temp: 98.1 F (36.7 C) 98.5 F (36.9 C)  98.2 F (36.8 C)  TempSrc: Oral Oral  Oral  SpO2: 100% 100%  100%  Weight:   54.5 kg    Filed Weights   03/06/24 0500  Weight: 54.5 kg    Examination: Coherent no distress no icterus no pallor Chest clear S1-S2 no murmur Abdomen soft no rebound ROM intact  Data Reviewed: reviewed   CBC    Component Value Date/Time   WBC 6.5 03/06/2024  0337   RBC 3.68 (L) 03/06/2024 0337   HGB 11.4 (L) 03/06/2024 0337   HGB 11.0 (L) 12/06/2022 0952   HCT 34.0 (L) 03/06/2024 0337   HCT 31.4 (L) 12/06/2022 0952   PLT 234 03/06/2024 0337   PLT 289 12/06/2022 0952   MCV 92.4 03/06/2024 0337   MCV 91 12/06/2022 0952   MCH 31.0 03/06/2024 0337   MCHC 33.5 03/06/2024 0337   RDW 12.5 03/06/2024 0337   RDW 11.5 (L) 12/06/2022 0952   LYMPHSABS 2.1 03/05/2024 0136   LYMPHSABS 1.7 08/09/2022 1431   MONOABS 0.7 03/05/2024 0136   EOSABS 0.1 03/05/2024 0136   EOSABS 0.1 08/09/2022 1431   BASOSABS 0.0 03/05/2024 0136   BASOSABS 0.0 08/09/2022 1431      Latest Ref Rng & Units 03/06/2024    3:37 AM 03/05/2024    1:36 AM 03/04/2024    2:48 PM  CMP  Glucose 70 - 99 mg/dL 72  88  811   BUN 6 - 20 mg/dL 5  7  9    Creatinine 0.44 - 1.00 mg/dL 9.14  7.82  9.56   Sodium 135 - 145 mmol/L 138  142  137   Potassium 3.5 - 5.1 mmol/L 3.5  3.7  3.4   Chloride 98 - 111 mmol/L 102  105  103   CO2 22 - 32 mmol/L 22  22  24    Calcium 8.9 -  10.3 mg/dL 9.0  8.6  9.3   Total Protein 6.5 - 8.1 g/dL 6.4  7.0  7.2   Total Bilirubin 0.0 - 1.2 mg/dL 5.3  3.2  6.1   Alkaline Phos 38 - 126 U/L 108  108  106   AST 15 - 41 U/L 213  267  384   ALT 0 - 44 U/L 383  515  582     Scheduled Meds:  mupirocin ointment  1 Application Nasal BID   Continuous Infusions:  sodium chloride 75 mL/hr at 03/06/24 0757   cefTRIAXone (ROCEPHIN)  IV 2 g (03/05/24 2148)    Time 44  Rhetta Mura, MD  [facility]

## 2024-03-06 NOTE — Anesthesia Postprocedure Evaluation (Signed)
 Anesthesia Post Note  Patient: Audrey Sanders  Procedure(s) Performed: ERCP, WITH INTERVENTION IF INDICATED BRUSH BIOPSY, BILE DUCT     Patient location during evaluation: PACU Anesthesia Type: General Level of consciousness: awake and alert Pain management: pain level controlled Vital Signs Assessment: post-procedure vital signs reviewed and stable Respiratory status: spontaneous breathing, nonlabored ventilation, respiratory function stable and patient connected to nasal cannula oxygen Cardiovascular status: blood pressure returned to baseline and stable Postop Assessment: no apparent nausea or vomiting Anesthetic complications: no   No notable events documented.  Last Vitals:  Vitals:   03/06/24 1620 03/06/24 1724  BP: 122/74 112/71  Pulse: 92 90  Resp: 15 16  Temp:  36.8 C  SpO2: 100% 100%    Last Pain:  Vitals:   03/06/24 1724  TempSrc: Oral  PainSc:                  [place]

## 2024-03-06 NOTE — Anesthesia Preprocedure Evaluation (Addendum)
 Anesthesia Evaluation  Patient identified by MRN, date of birth, ID band Patient awake    Reviewed: Allergy & Precautions, H&P , NPO status , Patient's Chart, lab work & pertinent test results  Airway Mallampati: II  TM Distance: >3 FB Neck ROM: Full    Dental no notable dental hx.    Pulmonary neg pulmonary ROS   Pulmonary exam normal breath sounds clear to auscultation       Cardiovascular negative cardio ROS Normal cardiovascular exam Rhythm:Regular Rate:Normal     Neuro/Psych negative neurological ROS  negative psych ROS   GI/Hepatic negative GI ROS, Neg liver ROS,,,  Endo/Other  negative endocrine ROS    Renal/GU negative Renal ROS  negative genitourinary   Musculoskeletal negative musculoskeletal ROS (+)    Abdominal   Peds negative pediatric ROS (+)  Hematology  (+) Blood dyscrasia, anemia Hb 11.4, plt 234   Anesthesia Other Findings   Reproductive/Obstetrics negative OB ROS Preg test neg 03/04/24                             Anesthesia Physical Anesthesia Plan  ASA: 1  Anesthesia Plan: General   Post-op Pain Management:    Induction: Intravenous  PONV Risk Score and Plan: Ondansetron, Dexamethasone, Midazolam and Treatment may vary due to age or medical condition  Airway Management Planned: Oral ETT  Additional Equipment: None  Intra-op Plan:   Post-operative Plan: Extubation in OR  Informed Consent: I have reviewed the patients History and Physical, chart, labs and discussed the procedure including the risks, benefits and alternatives for the proposed anesthesia with the patient or authorized representative who has indicated his/her understanding and acceptance.     Dental advisory given  Plan Discussed with: CRNA  Anesthesia Plan Comments:        Anesthesia Quick Evaluation

## 2024-03-06 NOTE — Plan of Care (Signed)

## 2024-03-07 ENCOUNTER — Inpatient Hospital Stay (HOSPITAL_COMMUNITY): Admitting: Anesthesiology

## 2024-03-07 ENCOUNTER — Encounter (HOSPITAL_COMMUNITY)
Admission: EM | Disposition: A | Discharge status: Home / Self Care | Payer: Self-pay | Source: Home / Self Care | Attending: Family Medicine

## 2024-03-07 ENCOUNTER — Encounter (HOSPITAL_COMMUNITY): Payer: Self-pay | Admitting: Internal Medicine

## 2024-03-07 ENCOUNTER — Inpatient Hospital Stay (HOSPITAL_COMMUNITY)

## 2024-03-07 DIAGNOSIS — K8020 Calculus of gallbladder without cholecystitis without obstruction: Secondary | ICD-10-CM | POA: Diagnosis not present

## 2024-03-07 DIAGNOSIS — K838 Other specified diseases of biliary tract: Secondary | ICD-10-CM

## 2024-03-07 DIAGNOSIS — R932 Abnormal findings on diagnostic imaging of liver and biliary tract: Secondary | ICD-10-CM

## 2024-03-07 DIAGNOSIS — K810 Acute cholecystitis: Secondary | ICD-10-CM | POA: Diagnosis not present

## 2024-03-07 HISTORY — PX: INTRAOPERATIVE CHOLANGIOGRAM: SHX5230

## 2024-03-07 HISTORY — PX: CHOLECYSTECTOMY: SHX55

## 2024-03-07 LAB — COMPREHENSIVE METABOLIC PANEL WITH GFR
ALT: 335 U/L — ABNORMAL HIGH (ref 0–44)
AST: 143 U/L — ABNORMAL HIGH (ref 15–41)
Albumin: 3.3 g/dL — ABNORMAL LOW (ref 3.5–5.0)
Alkaline Phosphatase: 100 U/L (ref 38–126)
Anion gap: 14 (ref 5–15)
BUN: 6 mg/dL (ref 6–20)
CO2: 17 mmol/L — ABNORMAL LOW (ref 22–32)
Calcium: 8.8 mg/dL — ABNORMAL LOW (ref 8.9–10.3)
Chloride: 106 mmol/L (ref 98–111)
Creatinine, Ser: 0.85 mg/dL (ref 0.44–1.00)
GFR, Estimated: >60 mL/min (ref >60)
Glucose, Bld: 81 mg/dL (ref 70–99)
Potassium: 4 mmol/L (ref 3.5–5.1)
Sodium: 137 mmol/L (ref 135–145)
Total Bilirubin: 2.7 mg/dL — ABNORMAL HIGH (ref 0.0–1.2)
Total Protein: 6.6 g/dL (ref 6.5–8.1)

## 2024-03-07 LAB — CBC
HCT: 35.6 % — ABNORMAL LOW (ref 36.0–46.0)
HCT: 36.6 % (ref 36.0–46.0)
Hemoglobin: 11.9 g/dL — ABNORMAL LOW (ref 12.0–15.0)
Hemoglobin: 12.1 g/dL (ref 12.0–15.0)
MCH: 30.6 pg (ref 26.0–34.0)
MCH: 30.3 pg (ref 26.0–34.0)
MCHC: 33.4 g/dL (ref 30.0–36.0)
MCHC: 33.1 g/dL (ref 30.0–36.0)
MCV: 91.5 fL (ref 80.0–100.0)
MCV: 91.7 fL (ref 80.0–100.0)
Platelets: 261 10*3/uL (ref 150–400)
Platelets: 265 10*3/uL (ref 150–400)
RBC: 3.89 MIL/uL (ref 3.87–5.11)
RBC: 3.99 MIL/uL (ref 3.87–5.11)
RDW: 12.6 % (ref 11.5–15.5)
RDW: 12.7 % (ref 11.5–15.5)
WBC: 9.2 10*3/uL (ref 4.0–10.5)
WBC: 14.9 10*3/uL — ABNORMAL HIGH (ref 4.0–10.5)
nRBC: 0 % (ref 0.0–0.2)
nRBC: 0 % (ref 0.0–0.2)

## 2024-03-07 LAB — NASOPHARYNGEAL CULTURE
Culture: NORMAL
Special Requests: NORMAL

## 2024-03-07 LAB — LIPASE, BLOOD: Lipase: 25 U/L (ref 11–51)

## 2024-03-07 SURGERY — LAPAROSCOPIC CHOLECYSTECTOMY
Anesthesia: General

## 2024-03-07 MED ORDER — MORPHINE SULFATE (PF) 2 MG/ML IV SOLN: 2.0000 mg | INTRAVENOUS | Status: DC | PRN

## 2024-03-07 MED ORDER — MIDAZOLAM HCL 2 MG/2ML IJ SOLN
INTRAMUSCULAR | Status: AC
Start: 2024-03-07 — End: ?
  Filled 2024-03-07: qty 2

## 2024-03-07 MED ORDER — 0.9 % SODIUM CHLORIDE (POUR BTL) OPTIME
TOPICAL | Status: DC | PRN
  Administered 2024-03-07: 1000 mL

## 2024-03-07 MED ORDER — ONDANSETRON HCL 4 MG/2ML IJ SOLN: 4.0000 mg | Freq: Once | INTRAMUSCULAR | Status: DC | PRN

## 2024-03-07 MED ORDER — FENTANYL CITRATE (PF) 100 MCG/2ML IJ SOLN: 25.0000 ug | INTRAMUSCULAR | Status: DC | PRN

## 2024-03-07 MED ORDER — SUGAMMADEX SODIUM 200 MG/2ML IV SOLN
INTRAVENOUS | Status: DC | PRN
  Administered 2024-03-07: 200 mg via INTRAVENOUS

## 2024-03-07 MED ORDER — OXYCODONE HCL 5 MG PO TABS
5.0000 mg | ORAL_TABLET | ORAL | Status: DC | PRN
  Administered 2024-03-07 – 2024-03-08 (×4): 5 mg via ORAL
  Filled 2024-03-07 (×4): qty 1

## 2024-03-07 MED ORDER — BUPIVACAINE-EPINEPHRINE 0.25% -1:200000 IJ SOLN
INTRAMUSCULAR | Status: DC | PRN
  Administered 2024-03-07: 25 mL

## 2024-03-07 MED ORDER — PROPOFOL 10 MG/ML IV BOLUS
INTRAVENOUS | Status: DC | PRN
  Administered 2024-03-07: 150 mg via INTRAVENOUS

## 2024-03-07 MED ORDER — VASOPRESSIN 20 UNIT/ML IV SOLN
INTRAVENOUS | Status: AC
  Filled 2024-03-07: qty 1

## 2024-03-07 MED ORDER — CEFAZOLIN SODIUM-DEXTROSE 2-4 GM/100ML-% IV SOLN
2.0000 g | Freq: Once | INTRAVENOUS | Status: AC
  Administered 2024-03-07: 2 g via INTRAVENOUS
  Filled 2024-03-07: qty 100

## 2024-03-07 MED ORDER — ONDANSETRON HCL 4 MG/2ML IJ SOLN
INTRAMUSCULAR | Status: DC | PRN
  Administered 2024-03-07: 4 mg via INTRAVENOUS

## 2024-03-07 MED ORDER — PROPOFOL 10 MG/ML IV BOLUS
INTRAVENOUS | Status: AC
  Filled 2024-03-07: qty 20

## 2024-03-07 MED ORDER — ROCURONIUM BROMIDE 10 MG/ML (PF) SYRINGE
PREFILLED_SYRINGE | INTRAVENOUS | Status: DC | PRN
  Administered 2024-03-07: 50 mg via INTRAVENOUS

## 2024-03-07 MED ORDER — FENTANYL CITRATE (PF) 250 MCG/5ML IJ SOLN
INTRAMUSCULAR | Status: DC | PRN
  Administered 2024-03-07: 50 ug via INTRAVENOUS
  Administered 2024-03-07: 100 ug via INTRAVENOUS

## 2024-03-07 MED ORDER — HYDROMORPHONE HCL 1 MG/ML IJ SOLN
0.2500 mg | INTRAMUSCULAR | Status: DC | PRN
  Administered 2024-03-07: 0.5 mg via INTRAVENOUS

## 2024-03-07 MED ORDER — MEPERIDINE HCL 25 MG/ML IJ SOLN: 6.2500 mg | INTRAMUSCULAR | Status: DC | PRN

## 2024-03-07 MED ORDER — HYDROMORPHONE HCL 1 MG/ML IJ SOLN
INTRAMUSCULAR | Status: AC
  Filled 2024-03-07: qty 1

## 2024-03-07 MED ORDER — LACTATED RINGERS IV SOLN: INTRAVENOUS | Status: DC

## 2024-03-07 MED ORDER — KETOROLAC TROMETHAMINE 30 MG/ML IJ SOLN: 30.0000 mg | Freq: Once | INTRAMUSCULAR | Status: DC | PRN

## 2024-03-07 MED ORDER — SODIUM CHLORIDE 0.9 % IV SOLN
INTRAVENOUS | Status: DC | PRN
  Administered 2024-03-07: 7 mL

## 2024-03-07 MED ORDER — BUPIVACAINE-EPINEPHRINE (PF) 0.25% -1:200000 IJ SOLN
INTRAMUSCULAR | Status: AC
  Filled 2024-03-07: qty 30

## 2024-03-07 MED ORDER — INDOCYANINE GREEN 25 MG IV SOLR
1.2500 mg | Freq: Once | INTRAVENOUS | Status: AC
  Administered 2024-03-07: 1.25 mg via TOPICAL

## 2024-03-07 MED ORDER — FENTANYL CITRATE (PF) 250 MCG/5ML IJ SOLN
INTRAMUSCULAR | Status: AC
  Filled 2024-03-07: qty 5

## 2024-03-07 MED ORDER — OXYCODONE HCL 5 MG PO TABS: 5.0000 mg | ORAL_TABLET | Freq: Once | ORAL | Status: DC | PRN

## 2024-03-07 MED ORDER — AMISULPRIDE (ANTIEMETIC) 5 MG/2ML IV SOLN: 10.0000 mg | Freq: Once | INTRAVENOUS | Status: DC | PRN

## 2024-03-07 MED ORDER — OXYCODONE HCL 5 MG/5ML PO SOLN: 5.0000 mg | Freq: Once | ORAL | Status: DC | PRN

## 2024-03-07 MED ORDER — ORAL CARE MOUTH RINSE: 15.0000 mL | Freq: Once | OROMUCOSAL | Status: AC

## 2024-03-07 MED ORDER — MIDAZOLAM HCL 2 MG/2ML IJ SOLN
INTRAMUSCULAR | Status: DC | PRN
  Administered 2024-03-07: 2 mg via INTRAVENOUS

## 2024-03-07 MED ORDER — DEXAMETHASONE SODIUM PHOSPHATE 10 MG/ML IJ SOLN
INTRAMUSCULAR | Status: DC | PRN
  Administered 2024-03-07: 10 mg via INTRAVENOUS

## 2024-03-07 MED ORDER — LIDOCAINE 2% (20 MG/ML) 5 ML SYRINGE
INTRAMUSCULAR | Status: DC | PRN
  Administered 2024-03-07: 50 mg via INTRAVENOUS

## 2024-03-07 MED ORDER — SODIUM CHLORIDE 0.9 % IR SOLN
Status: DC | PRN
  Administered 2024-03-07: 1000 mL

## 2024-03-07 MED ORDER — CHLORHEXIDINE GLUCONATE 0.12 % MT SOLN
15.0000 mL | Freq: Once | OROMUCOSAL | Status: AC
  Administered 2024-03-07: 15 mL via OROMUCOSAL
  Filled 2024-03-07: qty 15

## 2024-03-07 MED ORDER — LACTATED RINGERS IV SOLN: INTRAVENOUS | Status: DC | PRN

## 2024-03-07 MED ORDER — ONDANSETRON HCL 4 MG/2ML IJ SOLN: 4.0000 mg | Freq: Four times a day (QID) | INTRAMUSCULAR | Status: DC | PRN

## 2024-03-07 SURGICAL SUPPLY — 41 items
APPLIER CLIP 5 13 M/L LIGAMAX5 (MISCELLANEOUS) ×2 IMPLANT
APPLIER CLIP ROT 10 11.4 M/L (STAPLE) ×2 IMPLANT
BAG COUNTER SPONGE SURGICOUNT (BAG) ×2 IMPLANT
BLADE CLIPPER SURG (BLADE) IMPLANT
CANISTER SUCT 3000ML PPV (MISCELLANEOUS) ×2 IMPLANT
CHLORAPREP W/TINT 26 (MISCELLANEOUS) ×2 IMPLANT
CLIP APPLIE 5 13 M/L LIGAMAX5 (MISCELLANEOUS) ×2 IMPLANT
CLIP APPLIE ROT 10 11.4 M/L (STAPLE) IMPLANT
COVER SURGICAL LIGHT HANDLE (MISCELLANEOUS) ×2 IMPLANT
DERMABOND ADVANCED .7 DNX12 (GAUZE/BANDAGES/DRESSINGS) ×2 IMPLANT
ELECT REM PT RETURN 9FT ADLT (ELECTROSURGICAL) ×2 IMPLANT
ELECTRODE REM PT RTRN 9FT ADLT (ELECTROSURGICAL) ×2 IMPLANT
GLOVE BIO SURGEON STRL SZ8 (GLOVE) ×2 IMPLANT
GLOVE BIOGEL PI IND STRL 8 (GLOVE) ×2 IMPLANT
GOWN STRL REUS W/ TWL LRG LVL3 (GOWN DISPOSABLE) ×4 IMPLANT
GOWN STRL REUS W/ TWL XL LVL3 (GOWN DISPOSABLE) ×2 IMPLANT
IRRIG SUCT STRYKERFLOW 2 WTIP (MISCELLANEOUS) ×2 IMPLANT
IRRIGATION SUCT STRKRFLW 2 WTP (MISCELLANEOUS) ×2 IMPLANT
KIT BASIN OR (CUSTOM PROCEDURE TRAY) ×2 IMPLANT
KIT IMAGING PINPOINTPAQ (MISCELLANEOUS) IMPLANT
KIT TURNOVER KIT B (KITS) ×2 IMPLANT
L-HOOK LAP DISP 36CM (ELECTROSURGICAL) ×2 IMPLANT
LHOOK LAP DISP 36CM (ELECTROSURGICAL) ×2 IMPLANT
NDL 22X1.5 STRL (OR ONLY) (MISCELLANEOUS) ×2 IMPLANT
NEEDLE 22X1.5 STRL (OR ONLY) (MISCELLANEOUS) ×2 IMPLANT
NS IRRIG 1000ML POUR BTL (IV SOLUTION) ×2 IMPLANT
PAD ARMBOARD POSITIONER FOAM (MISCELLANEOUS) ×2 IMPLANT
PENCIL BUTTON HOLSTER BLD 10FT (ELECTRODE) ×2 IMPLANT
POUCH RETRIEVAL ECOSAC 10 (ENDOMECHANICALS) ×2 IMPLANT
SCISSORS LAP 5X35 DISP (ENDOMECHANICALS) ×2 IMPLANT
SET TUBE SMOKE EVAC HIGH FLOW (TUBING) ×2 IMPLANT
SLEEVE Z-THREAD 5X100MM (TROCAR) ×4 IMPLANT
SPECIMEN JAR SMALL (MISCELLANEOUS) ×2 IMPLANT
SUT VIC AB 4-0 PS2 27 (SUTURE) ×2 IMPLANT
TOWEL GREEN STERILE (TOWEL DISPOSABLE) ×2 IMPLANT
TOWEL GREEN STERILE FF (TOWEL DISPOSABLE) ×2 IMPLANT
TRAY LAPAROSCOPIC MC (CUSTOM PROCEDURE TRAY) ×2 IMPLANT
TROCAR BALLN 12MMX100 BLUNT (TROCAR) ×2 IMPLANT
TROCAR Z-THREAD OPTICAL 5X100M (TROCAR) ×2 IMPLANT
WARMER LAPAROSCOPE (MISCELLANEOUS) ×2 IMPLANT
WATER STERILE IRR 1000ML POUR (IV SOLUTION) ×2 IMPLANT

## 2024-03-07 NOTE — Progress Notes (Signed)
 Patient ID: Audrey Sanders, female   DOB: 2001/01/26, 23 y.o.   MRN: [id number] * Day of Surgery *    Subjective: Abd pain better, in pre-op ROS negative except as listed above. Objective: Vital signs in last 24 hours: Temp:  [97.8 F (36.6 C)-98.6 F (37 C)] 98.6 F (37 C) (04/11 0707) Pulse Rate:  [75-105] 81 (04/11 0500) Resp:  [15-20] 18 (04/11 0707) BP: (98-126)/(64-79) 113/72 (04/11 0707) SpO2:  [98 %-100 %] 98 % (04/11 0707) Weight:  [54.5 kg-56.7 kg] 56.7 kg (04/11 0707) Last BM Date : 03/03/24  Intake/Output from previous day: 04/10 0701 - 04/11 0700 In: 1300 [I.V.:1300] Out: -  Intake/Output this shift: No intake/output data recorded.  General appearance: alert and cooperative Resp: clear to auscultation bilaterally GI: soft, NT, ND  Lab Results: CBC  Recent Labs    03/06/24 0337 03/07/24 0242  WBC 6.5 9.2  HGB 11.4* 11.9*  HCT 34.0* 35.6*  PLT 234 261   BMET Recent Labs    03/06/24 0337 03/07/24 0242  NA 138 137  K 3.5 4.0  CL 102 106  CO2 22 17*  GLUCOSE 72 81  BUN <5* 6  CREATININE 0.76 0.85  CALCIUM 9.0 8.8*   PT/INR Recent Labs    03/05/24 0136  LABPROT 14.3  INR 1.1   ABG No results for input(s): "PHART", "HCO3" in the last 72 hours.  Invalid input(s): "PCO2", "PO2"  Studies/Results: DG ERCP Result Date: 03/06/2024 CLINICAL DATA:  [id number] Elective surgery [id number] EXAM: ERCP COMPARISON:  MRCP, 03/05/2024. The abdominal sound, 03/04/2024 and 07/08/2023. FLUOROSCOPY: Exposure Index (as provided by the fluoroscopic device): 21.3 mGy Kerma FINDINGS: Limited oblique planar images of the RIGHT upper quadrant obtained C-arm. Images demonstrating flexible endoscopy, biliary duct cannulation, sphincterotomy, retrograde cholangiogram and balloon sweep. Mild extrahepatic biliary ductal dilation. No evidence of biliary filling defect is demonstrated. IMPRESSION: Fluoroscopic imaging for ERCP. For complete description of intra procedural findings,  please see performing service dictation. Electronically Signed   By: Roanna Banning M.D.   On: 03/06/2024 16:07    Anti-infectives: Anti-infectives (From admission, onward)    Start     Dose/Rate Route Frequency Ordered Stop   03/06/24 1245  Ampicillin-Sulbactam (UNASYN) 3 g in sodium chloride 0.9 % 100 mL IVPB  Status:  Discontinued        3 g 200 mL/hr over 30 Minutes Intravenous  Once 03/06/24 1234 03/06/24 1634   03/04/24 2200  cefTRIAXone (ROCEPHIN) 2 g in sodium chloride 0.9 % 100 mL IVPB  Status:  Discontinued        2 g 200 mL/hr over 30 Minutes Intravenous Every 24 hours 03/04/24 2150 03/06/24 1017       Assessment/Plan: Cholelithiasis w/ elevated LFT's - RUQ [place] and MRCP w/ cholelithiasis without evidence of cholecystitis. WBC wnl - MRCP w/ intra-hepatic and CBD dilatation w/o choledocholithiasis but possible stricture.  - ERCP by Dr. Leone Payor 4/10, no stones but CBD drained better after sphincterotomy - for lap chole/IOC this am. Procedure, risks, and benefits discussed and she agrees.  FEN - NPO for OR VTE - SCDs, okay for chem ppx from a general surgery standpoint ID - Rocephin per primary completed, ancef periop  Incidental findings - Splenules. Azygous continuation of the IVC. R breast nodule - recommend outpt follow up for dedicated breast imaging    LOS: 3 days    Violeta Gelinas, MD, MPH, FACS Trauma & General Surgery Use [email or web address] to contact on call provider  03/07/2024

## 2024-03-07 NOTE — Op Note (Signed)
 Laparoscopic Cholecystectomy with IOC Procedure Note   Pre-operative Diagnosis: elevated LFTs, S/P ERCP  Post-operative Diagnosis: elevated LFTs, S/P ERCP  Surgeon: Liz Malady   Assistants: Llana Aliment, RNFA  Anesthesia: General endotracheal anesthesia  Procedure Details  The patient was seen again in the Holding Room. The risks, benefits, complications, treatment options, and expected outcomes were discussed with the patient. The possibilities of reaction to medication, pulmonary aspiration, perforation of viscus, bleeding, recurrent infection, finding a normal gallbladder, the need for additional procedures, failure to diagnose a condition, the possible need to convert to an open procedure, and creating a complication requiring transfusion or operation were discussed with the patient. The likelihood of improving the patient's symptoms with return to their baseline status is good.  The patient and/or family concurred with the proposed plan, giving informed consent. The site of surgery properly noted. The patient was taken to Operating Room, identified as Trude Mcburney and the procedure verified as Laparoscopic Cholecystectomy with Intraoperative Cholangiogram. A Time Out was held and the above information confirmed.  Prior to the induction of general anesthesia, antibiotic prophylaxis was administered. General endotracheal anesthesia was then administered and tolerated well. After the induction, the abdomen was prepped with Chloraprep and draped in the sterile fashion. The patient was positioned in the supine position.  Local anesthetic agent was injected into the skin near the umbilicus and an incision made. We dissected down to the abdominal fascia with blunt dissection.  The fascia was incised vertically and we entered the peritoneal cavity bluntly.  A pursestring suture of 0-Vicryl was placed around the fascial opening.  The Hasson cannula was inserted and secured with the stay  suture.  Pneumoperitoneum was then created with CO2 and tolerated well without any adverse changes in the patient's vital signs.  A subxiphoid incision was made but there was a significant vein in the area that was bleeding despite cautery.  I placed a pursestring 4-0 Vicryl for hemostasis and then made a new incision more lateral and placed a 5 mm port..  Two 5-mm ports were placed in the right upper quadrant. All skin incisions were infiltrated with a local anesthetic agent before making the incision and placing the trocars.   We positioned the patient in reverse Trendelenburg, tilted slightly to the patient's left.  The gallbladder was identified, the fundus grasped and retracted cephalad. Adhesions were lysed bluntly and with the electrocautery where indicated, taking care not to injure any adjacent organs or viscus. The infundibulum was grasped and retracted laterally, exposing the peritoneum overlying the triangle of Calot. This was then divided and exposed in a blunt fashion. A critical view of the cystic duct and cystic artery was obtained.  The cystic duct was clearly identified and bluntly dissected circumferentially. A clip was placed at the infundibulocystic duct junction.   An incision was made in the cystic duct and the [facility] cholangiogram catheter introduced.  The 5 mm clip applier was malfunctioning and I could not get a good clip over the cholangiogram catheter.  A second 5 mm clip applier also malfunction.  These were taken off the field and sent back to the manufacture with for documentation.  I upsized the epigastric port to an 11 mm.  I then used the larger clip applier with success.  The catheter was secured using a clip. A cholangiogram was then obtained which showed dilated intrahepatic biliary tree and common bile duct but there is flow down through the ampulla into the duodenum.The cystic duct was  then ligated with clips and divided. The cystic artery was identified, dissected free,  ligated with clips and divided as well.   The gallbladder was dissected from the liver bed in retrograde fashion with the electrocautery. The gallbladder was removed and placed in an Endocatch sac. The liver bed was irrigated and inspected. Hemostasis was achieved with the electrocautery. Copious irrigation was utilized and was repeatedly aspirated until clear.  The gallbladder and Endocatch sac were then removed through the umbilical port site.  The pursestring suture was used to close the umbilical fascia.    We again inspected the right upper quadrant for hemostasis.  Pneumoperitoneum was released as we removed the trocars.  4-0 Vicryl was used to close the skin.  Dermabond was applied.  The patient was then extubated and brought to the recovery room in stable condition. Instrument, sponge, and needle counts were correct at closure and at the conclusion of the case.   Findings: Distended gallbladder filled with contrast status post ERCP  Estimated Blood Loss: less than 50 mL         Drains: none         Specimens: Gallbladder           Complications: None; patient tolerated the procedure well.         Disposition: PACU - hemodynamically stable.         Condition: stable  Violeta Gelinas, MD, MPH, FACS Pager: [phone]

## 2024-03-07 NOTE — Anesthesia Postprocedure Evaluation (Signed)
 Anesthesia Post Note  Patient: Audrey Sanders  Procedure(s) Performed: LAPAROSCOPIC CHOLECYSTECTOMY CHOLANGIOGRAM, INTRAOPERATIVE     Patient location during evaluation: PACU Anesthesia Type: General Level of consciousness: awake and alert Pain management: pain level controlled Vital Signs Assessment: post-procedure vital signs reviewed and stable Respiratory status: spontaneous breathing, nonlabored ventilation, respiratory function stable and patient connected to nasal cannula oxygen Cardiovascular status: blood pressure returned to baseline and stable Postop Assessment: no apparent nausea or vomiting Anesthetic complications: no   No notable events documented.  Last Vitals:  Vitals:   03/07/24 1051 03/07/24 1201  BP: 109/71 111/69  Pulse: 96 91  Resp: 17 16  Temp: 37.1 C 36.9 C  SpO2: 98% 96%    Last Pain:  Vitals:   03/07/24 1438  TempSrc:   PainSc: 7                  Cydnie Deason S

## 2024-03-07 NOTE — Discharge Instructions (Signed)

## 2024-03-07 NOTE — Anesthesia Preprocedure Evaluation (Signed)
 Anesthesia Evaluation  Patient identified by MRN, date of birth, ID band Patient awake    Reviewed: Allergy & Precautions, H&P , NPO status , Patient's Chart, lab work & pertinent test results  Airway Mallampati: II   Neck ROM: full    Dental   Pulmonary neg pulmonary ROS   breath sounds clear to auscultation       Cardiovascular negative cardio ROS  Rhythm:regular Rate:Normal     Neuro/Psych    GI/Hepatic Gallstones with biliary obstruction.  Elevated LFTs. ALT 335 AST 143   Endo/Other    Renal/GU      Musculoskeletal   Abdominal   Peds  Hematology   Anesthesia Other Findings   Reproductive/Obstetrics                             Anesthesia Physical Anesthesia Plan  ASA: 2  Anesthesia Plan: General   Post-op Pain Management:    Induction: Intravenous  PONV Risk Score and Plan: 3 and Ondansetron, Dexamethasone, Midazolam and Treatment may vary due to age or medical condition  Airway Management Planned: Oral ETT  Additional Equipment:   Intra-op Plan:   Post-operative Plan: Extubation in OR  Informed Consent: I have reviewed the patients History and Physical, chart, labs and discussed the procedure including the risks, benefits and alternatives for the proposed anesthesia with the patient or authorized representative who has indicated his/her understanding and acceptance.     Dental advisory given  Plan Discussed with: CRNA, Anesthesiologist and Surgeon  Anesthesia Plan Comments:        Anesthesia Quick Evaluation

## 2024-03-07 NOTE — Anesthesia Procedure Notes (Signed)
 Procedure Name: Intubation Date/Time: 03/07/2024 9:17 AM  Performed by: Venia Carbon, CRNAPre-anesthesia Checklist: Patient identified, Emergency Drugs available, Suction available, Patient being monitored and Timeout performed Patient Re-evaluated:Patient Re-evaluated prior to induction Oxygen Delivery Method: Circle system utilized Preoxygenation: Pre-oxygenation with 100% oxygen Induction Type: IV induction Ventilation: Mask ventilation without difficulty Laryngoscope Size: Mac and 3 Grade View: Grade I Tube type: Oral Tube size: 7.0 mm Number of attempts: 1 Airway Equipment and Method: Patient positioned with wedge pillow and Stylet Placement Confirmation: ETT inserted through vocal cords under direct vision, positive ETCO2, CO2 detector and breath sounds checked- equal and bilateral Secured at: 22 cm Tube secured with: Tape

## 2024-03-07 NOTE — Transfer of Care (Signed)
 Immediate Anesthesia Transfer of Care Note  Patient: Audrey Sanders  Procedure(s) Performed: LAPAROSCOPIC CHOLECYSTECTOMY CHOLANGIOGRAM, INTRAOPERATIVE  Patient Location: PACU  Anesthesia Type:General  Level of Consciousness: awake, alert , oriented, and patient cooperative  Airway & Oxygen Therapy: Patient Spontanous Breathing and Patient connected to face mask oxygen  Post-op Assessment: Report given to RN, Post -op Vital signs reviewed and stable, Patient moving all extremities, Patient moving all extremities X 4, and Patient able to stick tongue midline  Post vital signs: Reviewed and stable  Last Vitals:  Vitals Value Taken Time  BP 133/63 03/07/24 1015  Temp    Pulse 106 03/07/24 1018  Resp 20 03/07/24 1018  SpO2 94 % 03/07/24 1018  Vitals shown include unfiled device data.  Last Pain:  Vitals:   03/07/24 0717  TempSrc:   PainSc: 0-No pain         Complications: No notable events documented.

## 2024-03-07 NOTE — Plan of Care (Signed)
   Problem: Health Behavior/Discharge Planning: Goal: Ability to manage health-related needs will improve Outcome: Progressing   Problem: Clinical Measurements: Goal: Ability to maintain clinical measurements within normal limits will improve Outcome: Progressing

## 2024-03-07 NOTE — Progress Notes (Signed)
 [facility] ROUNDING NOTE Audrey Sanders WUJ:[id number]  DOB: 14-Aug-2001  DOA: 03/04/2024  PCP: Pcp, No  03/07/2024,2:19 PM  LOS: 3 days    Code Status: Full code   from: Home current Dispo: Likely home   23 year old female with past medical history of VSD followed by Dr. Carlena Sax to follow-up in 2 years Presented with abdominal pain went to urgent care on 4/7 and had pain nausea vomiting T. bili was 5.5 with elevated AST ALT in the 1:2 ratio RUQ US showed CBD dilatation no cholecystitis-MRCP showed intrahepatic CBD dilatation --?stricture/narrowing of distal CBD?  Stricture  GI and general surgery consulted 4/10 ERCP performed showing moderate bile duct dilatation?  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-distortion or caliber of distal duct which resolved with biliary sphincterotomy and unusual location of papula and J-shaped stomach making procedure approach difficult 4/11 lap chole performed--IOC dilated intrahepatic biliary tree CBD but flow through ampulla into the duodenum   Plan   acute cholecystitis with probable choledocholithiasis versus stricture based on MRCP ERCP as above, lap chole as above DC saline-diet as per general surgery appears to be clear liquid Can advance recc perioperative antibiotics Pain control Tylenol first choice Oxy IR 5-10 every 4 as needed pain 4-6, morphine 2 mg every 3.  Breakthrough, Dilaudid 0.5 every 2 as needed severe pain Expect if is able to tolerate diet well could potentially discharge home tomorrow a.m.  Muscular VSD Outpatient follow-up with cardiology  Abnormal MRI of breast-enhancing nodule right breast 2.3X 1.5 cm-PCP to arrange outpatient dedicated breast ultrasound   DVT prophylaxis: SCD  Status is: Inpatient Remains inpatient appropriate because:   Requires further management    Subjective:  Sleepy as is postop but otherwise feels fair No distress   Objective + exam Vitals:   03/07/24 1030 03/07/24 1045 03/07/24 1051 03/07/24 1201   BP: 121/75 105/68 109/71 111/69  Pulse: 91 87 96 91  Resp: 17 20 17 16   Temp:   98.7 F (37.1 C) 98.4 F (36.9 C)  TempSrc:      SpO2: 100% 99% 98% 96%  Weight:      Height:       Filed Weights   03/06/24 1230 03/07/24 0500 03/07/24 0707  Weight: 54.5 kg 54.5 kg 56.7 kg    Examination:  A little sleepy but awakens S1-S2 no murmur Postop changes with lap chole scars noted ROM intact  Data Reviewed: reviewed   CBC    Component Value Date/Time   WBC 9.2 03/07/2024 0242   RBC 3.89 03/07/2024 0242   HGB 11.9 (L) 03/07/2024 0242   HGB 11.0 (L) 12/06/2022 0952   HCT 35.6 (L) 03/07/2024 0242   HCT 31.4 (L) 12/06/2022 0952   PLT 261 03/07/2024 0242   PLT 289 12/06/2022 0952   MCV 91.5 03/07/2024 0242   MCV 91 12/06/2022 0952   MCH 30.6 03/07/2024 0242   MCHC 33.4 03/07/2024 0242   RDW 12.6 03/07/2024 0242   RDW 11.5 (L) 12/06/2022 0952   LYMPHSABS 2.1 03/05/2024 0136   LYMPHSABS 1.7 08/09/2022 1431   MONOABS 0.7 03/05/2024 0136   EOSABS 0.1 03/05/2024 0136   EOSABS 0.1 08/09/2022 1431   BASOSABS 0.0 03/05/2024 0136   BASOSABS 0.0 08/09/2022 1431      Latest Ref Rng & Units 03/07/2024    2:42 AM 03/06/2024    3:37 AM 03/05/2024    1:36 AM  CMP  Glucose 70 - 99 mg/dL 81  72  88   BUN 6 -  20 mg/dL 6  <5  7   Creatinine 1.47 - 1.00 mg/dL 8.29  5.62  1.30   Sodium 135 - 145 mmol/L 137  138  142   Potassium 3.5 - 5.1 mmol/L 4.0  3.5  3.7   Chloride 98 - 111 mmol/L 106  102  105   CO2 22 - 32 mmol/L 17  22  22    Calcium 8.9 - 10.3 mg/dL 8.8  9.0  8.6   Total Protein 6.5 - 8.1 g/dL 6.6  6.4  7.0   Total Bilirubin 0.0 - 1.2 mg/dL 2.7  5.3  3.2   Alkaline Phos 38 - 126 U/L 100  108  108   AST 15 - 41 U/L 143  213  267   ALT 0 - 44 U/L 335  383  515     Scheduled Meds:  HYDROmorphone       mupirocin ointment  1 Application Nasal BID   Continuous Infusions:    Time 44  Rhetta Mura, MD  [facility]

## 2024-03-07 NOTE — Progress Notes (Signed)
   Patient Name: Audrey Sanders Date of Encounter: 03/07/2024, 4:56 PM     Assessment and Plan  #1-filling defect on intraoperative cholangiogram with persistent moderate biliary ductal dilation #2 status post laparoscopic cholecystectomy today #3 status post ERCP with biliary sphincterotomy and no bile duct stones produced with balloon sweeps and negative occlusion cholangiogram.  ------------------------------------------------------------------------------------------------------------------------  I have reviewed the IOC images.  On the second set of images there is a filling defect consistent with a stone in the distal bile duct, contrast does drain into the duodenum. Her ERCP was technically difficult given the location of the papilla.  The filling defect on IOC suspicious for stone could very well pass spontaneously.    I recommend we observe LFTs, they could potentially increase tomorrow post lap chole.  Assuming they are trending down and she is recovering otherwise they could be followed to normal.  Additional imaging with MRCP versus repeating an ERCP will depend upon clinical course.  [facility] will follow-up tomorrow.  Iva Boop, MD, [facility] See Loretha Stapler on call - gastroenterology for best contact person 03/07/2024 5:01 PM    Subjective  Resting after laparoscopic cholecystectomy, denies pain.   Objective  BP 111/69   Pulse 91   Temp 98.4 F (36.9 C)   Resp 16   Ht 5\' 1"  (1.549 m)   Wt 56.7 kg   LMP 02/09/2024 (Approximate)   SpO2 96%   BMI 23.62 kg/m     Recent Labs  Lab 03/03/24 1407 03/04/24 1448 03/05/24 0136 03/06/24 0337 03/07/24 0242  AST 753* 384* 267* 213* 143*  ALT 692* 582* 515* 383* 335*  ALKPHOS 109 106 108 108 100  BILITOT 5.5* 6.1* 3.2* 5.3* 2.7*  PROT 7.6 7.2 7.0 6.4* 6.6  ALBUMIN 3.8 3.7 3.5 3.1* 3.3*  INR  --   --  1.1  --   --      DG Cholangiogram Operative CLINICAL DATA:  Laparoscopic  cholecystectomy.  EXAM: INTRAOPERATIVE CHOLANGIOGRAM  TECHNIQUE: Cholangiographic images from the C-arm fluoroscopic device were submitted for interpretation post-operatively. Please see the procedural report for the amount of contrast and the fluoroscopy time utilized.  FLUOROSCOPY: Radiation Exposure Index (as provided by the fluoroscopic device): 7.71 mGy Kerma  COMPARISON:  ERCP dated 03/06/2024. MR abdomen and MR CP dated 03/05/2024. Limited right upper quadrant abdomen ultrasound dated 03/04/2024  FINDINGS: An initial cine clip of the right upper abdomen demonstrates contrast injection into the cystic duct with progressive opacification of the common duct and intrahepatic ducts. Mild-to-moderate extrahepatic and intrahepatic ductal dilatation. No intraductal filling defects are seen. Multiple rounded filling defects are noted in the gallbladder.  A 2nd cine clip demonstrates a small oval filling defect in the distal common duct and the rounded filling defects in the gallbladder.  Two additional C-arm images demonstrate the same small oval filling defect in the distal common duct and rounded filling defects in the gallbladder with unchanged biliary ductal dilatation.  IMPRESSION: 1. Persistent small oval filling defect in the distal common duct, suspicious for retained common duct stone with associated biliary ductal dilatation. 2. Cholelithiasis.  Electronically Signed   By: Beckie Salts M.D.   On: 03/07/2024 11:41       Iva Boop, MD, [facility] See Loretha Stapler on call - gastroenterology for best contact person 03/07/2024 4:56 PM

## 2024-03-08 ENCOUNTER — Encounter (HOSPITAL_COMMUNITY): Payer: Self-pay | Admitting: General Surgery

## 2024-03-08 ENCOUNTER — Other Ambulatory Visit (HOSPITAL_COMMUNITY): Payer: Self-pay

## 2024-03-08 DIAGNOSIS — K810 Acute cholecystitis: Secondary | ICD-10-CM | POA: Diagnosis not present

## 2024-03-08 LAB — COMPREHENSIVE METABOLIC PANEL WITH GFR
ALT: 274 U/L — ABNORMAL HIGH (ref 0–44)
AST: 122 U/L — ABNORMAL HIGH (ref 15–41)
Albumin: 3.2 g/dL — ABNORMAL LOW (ref 3.5–5.0)
Alkaline Phosphatase: 79 U/L (ref 38–126)
Anion gap: 12 (ref 5–15)
BUN: 6 mg/dL (ref 6–20)
CO2: 24 mmol/L (ref 22–32)
Calcium: 9 mg/dL (ref 8.9–10.3)
Chloride: 102 mmol/L (ref 98–111)
Creatinine, Ser: 0.76 mg/dL (ref 0.44–1.00)
GFR, Estimated: >60 mL/min (ref >60)
Glucose, Bld: 95 mg/dL (ref 70–99)
Potassium: 3.5 mmol/L (ref 3.5–5.1)
Sodium: 138 mmol/L (ref 135–145)
Total Bilirubin: 2.2 mg/dL — ABNORMAL HIGH (ref 0.0–1.2)
Total Protein: 6.3 g/dL — ABNORMAL LOW (ref 6.5–8.1)

## 2024-03-08 LAB — CBC
HCT: 33.9 % — ABNORMAL LOW (ref 36.0–46.0)
Hemoglobin: 11.3 g/dL — ABNORMAL LOW (ref 12.0–15.0)
MCH: 30.5 pg (ref 26.0–34.0)
MCHC: 33.3 g/dL (ref 30.0–36.0)
MCV: 91.4 fL (ref 80.0–100.0)
Platelets: 235 10*3/uL (ref 150–400)
RBC: 3.71 MIL/uL — ABNORMAL LOW (ref 3.87–5.11)
RDW: 12.8 % (ref 11.5–15.5)
WBC: 11.1 10*3/uL — ABNORMAL HIGH (ref 4.0–10.5)
nRBC: 0 % (ref 0.0–0.2)

## 2024-03-08 MED ORDER — OXYCODONE HCL 5 MG PO TABS
5.0000 mg | ORAL_TABLET | ORAL | 0 refills | Status: AC | PRN
Start: 2024-03-08 — End: ?
  Filled 2024-03-08: qty 30, 3d supply, fill #0

## 2024-03-08 NOTE — Discharge Summary (Signed)
 Physician Discharge Summary  Audrey Sanders QJJ:[id number] DOB: 09-03-2001 DOA: 03/04/2024  PCP: Pcp, No  Admit date: 03/04/2024 Discharge date: 03/08/2024  Time spent: 40 minutes  Recommendations for Outpatient Follow-up:  outpatient follow-up with GI, needs Chem-12 CBC in about 1 week at PCP office Recommend dedicated breast ultrasound for abnormal breast enhancing nodule of right breast-may just be fibrofatty disease of the breast  Discharge Diagnoses:  MAIN problem for hospitalization   Acute cholecystitis with either choledocholithiasis versus stricture  Please see below for itemized issues addressed in [facility]- refer to other progress notes for clarity if needed  Discharge Condition: Improved  Diet recommendation: Heart healthy  Filed Weights   03/07/24 0500 03/07/24 0707 03/08/24 0500  Weight: 54.5 kg 56.7 kg 57.1 kg    History of present illness:  23 year old female with past medical history of VSD followed by Dr. Carlena Sax to follow-up in 2 years Presented with abdominal pain went to urgent care on 4/7 and had pain nausea vomiting T. bili was 5.5 with elevated AST ALT in the 1:2 ratio RUQ US showed CBD dilatation no cholecystitis-MRCP showed intrahepatic CBD dilatation --?stricture/narrowing of distal CBD?  Stricture  GI and general surgery consulted 4/10 ERCP performed showing moderate bile duct dilatation?  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-distortion or caliber of distal duct which resolved with biliary sphincterotomy and unusual location of papula and J-shaped stomach making procedure approach difficult 4/11 lap chole performed--IOC dilated intrahepatic biliary tree CBD but flow through ampulla into the duodenum     Plan    acute cholecystitis with probable choledocholithiasis versus stricture based on MRCP ERCP as above, lap chole as above Pain control Tylenol first choice, was prescribed Naprosyn/ibuprofen and then Percocet GI felt patient was cleared for  discharge and they will follow-up in see her in the clinic after labs etc. She will probably need a PCP as well   Muscular VSD Outpatient follow-up with cardiology   Abnormal MRI of breast-enhancing nodule right breast 2.3X 1.5 cm-PCP to arrange outpatient dedicated breast ultrasound    Discharge Exam: Vitals:   03/08/24 0447 03/08/24 0719  BP: 104/68 106/74  Pulse: 80 72  Resp: 18 16  Temp: (!) 97.5 F (36.4 C) 98.2 F (36.8 C)  SpO2: 100% 100%    Subj on day of d/c   Awake coherent well not hungry no pain nausea vomiting eating some   General Exam on discharge  EOMI NCAT no focal deficit no icterus no pallor no wheeze no rales or rhonchi Chest clear Heart S1-S2 no murmur Postop changes to abdomen No lower extremity edema  Discharge Instructions   Discharge Instructions     Diet - low sodium heart healthy   Complete by: As directed    Discharge instructions   Complete by: As directed    Make sure that you follow the weight precautions for lifting things and do not get the areas on your abdomen drenched or soaked-follow-up postop instructions as per general surgery Gastroenterology will call you for follow-up on your liver function tests as this needs to be followed you have some areas in your liver that may be kinked and this may need to be looked at again Follow-up with your primary physician get labs in a week or so Continue Tylenol for regular pain ibuprofen 800 every 6 for moderate to severe pain and oxycodone for very severe pain If you have high fever chills nausea vomiting or other issues come back to the emergency room   Increase  activity slowly   Complete by: As directed       Allergies as of 03/08/2024   No Known Allergies      Medication List     TAKE these medications    ibuprofen 200 MG tablet Commonly known as: ADVIL Take 800 mg by mouth every 6 (six) hours as needed.   oxyCODONE 5 MG immediate release tablet Commonly known as: Oxy  IR/ROXICODONE Take 1-2 tablets (5-10 mg total) by mouth every 4 (four) hours as needed for moderate pain (pain score 4-6) or severe pain (pain score 7-10) (5mg  mod, 10mg  severe).       No Known Allergies  Follow-up Information     Maczis, Puja Gosai, PA-C Follow up.   Specialty: General Surgery Why: Call to confirm your appointment date and time, bring a copy of your photo ID and insurance card, arrive 30 minutes prior to your appointment Contact information: [address] [place] [address] [phone]                  The results of significant diagnostics from this hospitalization (including imaging, microbiology, ancillary and laboratory) are listed below for reference.    Significant Diagnostic Studies: DG Cholangiogram Operative Result Date: 03/07/2024 CLINICAL DATA:  Laparoscopic cholecystectomy. EXAM: INTRAOPERATIVE CHOLANGIOGRAM TECHNIQUE: Cholangiographic images from the C-arm fluoroscopic device were submitted for interpretation post-operatively. Please see the procedural report for the amount of contrast and the fluoroscopy time utilized. FLUOROSCOPY: Radiation Exposure Index (as provided by the fluoroscopic device): 7.71 mGy Kerma COMPARISON:  ERCP dated 03/06/2024. MR abdomen and MR CP dated 03/05/2024. Limited right upper quadrant abdomen ultrasound dated 03/04/2024 FINDINGS: An initial cine clip of the right upper abdomen demonstrates contrast injection into the cystic duct with progressive opacification of the common duct and intrahepatic ducts. Mild-to-moderate extrahepatic and intrahepatic ductal dilatation. No intraductal filling defects are seen. Multiple rounded filling defects are noted in the gallbladder. A 2nd cine clip demonstrates a small oval filling defect in the distal common duct and the rounded filling defects in the gallbladder. Two additional C-arm images demonstrate the same small oval filling defect in the distal common duct and  rounded filling defects in the gallbladder with unchanged biliary ductal dilatation. IMPRESSION: 1. Persistent small oval filling defect in the distal common duct, suspicious for retained common duct stone with associated biliary ductal dilatation. 2. Cholelithiasis. Electronically Signed   By: Catherin Closs M.D.   On: 03/07/2024 11:41   DG ERCP Result Date: 03/06/2024 CLINICAL DATA:  [id number] Elective surgery [id number] EXAM: ERCP COMPARISON:  MRCP, 03/05/2024. The abdominal sound, 03/04/2024 and 07/08/2023. FLUOROSCOPY: Exposure Index (as provided by the fluoroscopic device): 21.3 mGy Kerma FINDINGS: Limited oblique planar images of the RIGHT upper quadrant obtained C-arm. Images demonstrating flexible endoscopy, biliary duct cannulation, sphincterotomy, retrograde cholangiogram and balloon sweep. Mild extrahepatic biliary ductal dilation. No evidence of biliary filling defect is demonstrated. IMPRESSION: Fluoroscopic imaging for ERCP. For complete description of intra procedural findings, please see performing service dictation. Electronically Signed   By: Art Largo M.D.   On: 03/06/2024 16:07   MR ABDOMEN MRCP W WO CONTAST Result Date: 03/05/2024 CLINICAL DATA:  Jaundice. EXAM: MRI ABDOMEN WITHOUT AND WITH CONTRAST (INCLUDING MRCP) TECHNIQUE: Multiplanar multisequence MR imaging of the abdomen was performed both before and after the administration of intravenous contrast. Heavily T2-weighted images of the biliary and pancreatic ducts were obtained, and three-dimensional MRCP images were rendered by post processing. CONTRAST:  10mL GADAVIST GADOBUTROL 1  MMOL/ML IV SOLN COMPARISON:  Right upper quadrant sonogram 03/04/2024 FINDINGS: Lower chest: No acute findings. Hepatobiliary: No enhancing liver lesion identified. Gallstones identified. These measure up to 7 mm. The gallbladder appears distended without wall thickening or pericholecystic fluid/inflammation. Intrahepatic and common bile duct dilatation. Beak  like narrowing of the distal common bile duct is noted without signs of choledocholithiasis. Pancreas: There is no main duct dilatation. Congenitally absent tail of pancreas. No pancreatic inflammation or mass identified. 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 Adrenals/Urinary Tract: Normal adrenal glands. There is a striated nephrographic appearance of both kidneys. No mass or hydronephrosis identified. Stomach/Bowel: Visualized portions within the abdomen are unremarkable. Vascular/Lymphatic: Normal caliber of the abdominal aorta. No signs of abdominal adenopathy. Azygous continuation of the IVC. Other:  No ascites or focal fluid collections. Musculoskeletal: Within the right breast there is an enhancing nodule which measures 2.3 by 1.5 cm, image 06/1000. No enhancing bone lesions identified. IMPRESSION: 1. Intrahepatic and common bile duct dilatation. Beak like narrowing of the distal common bile duct is noted without signs of choledocholithiasis. 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 2. Cholelithiasis. No signs of acute cholecystitis. 3. Striated nephrographic appearance of both kidneys. Findings are nonspecific but can be seen in the setting of pyelonephritis. Correlate with urinalysis. 4. Enhancing nodule within the right breast measures 2.3 x 1.5 cm. Recommend further evaluation with dedicated breast imaging. 5. 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 6. Azygous continuation of the IVC. Electronically Signed   By: Kimberley Penman M.D.   On: 03/05/2024 05:16   MR 3D Recon At Scanner Result Date: 03/05/2024 CLINICAL DATA:  Jaundice. EXAM: MRI ABDOMEN WITHOUT AND WITH CONTRAST (INCLUDING MRCP) TECHNIQUE: Multiplanar multisequence MR imaging of the abdomen was performed both before and after the administration of intravenous contrast. Heavily T2-weighted images of the biliary and pancreatic ducts were obtained, and  three-dimensional MRCP images were rendered by post processing. CONTRAST:  10mL GADAVIST GADOBUTROL 1 MMOL/ML IV SOLN COMPARISON:  Right upper quadrant sonogram 03/04/2024 FINDINGS: Lower chest: No acute findings. Hepatobiliary: No enhancing liver lesion identified. Gallstones identified. These measure up to 7 mm. The gallbladder appears distended without wall thickening or pericholecystic fluid/inflammation. Intrahepatic and common bile duct dilatation. Beak like narrowing of the distal common bile duct is noted without signs of choledocholithiasis. Pancreas: There is no main duct dilatation. Congenitally absent tail of pancreas. No pancreatic inflammation or mass identified. 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 Adrenals/Urinary Tract: Normal adrenal glands. There is a striated nephrographic appearance of both kidneys. No mass or hydronephrosis identified. Stomach/Bowel: Visualized portions within the abdomen are unremarkable. Vascular/Lymphatic: Normal caliber of the abdominal aorta. No signs of abdominal adenopathy. Azygous continuation of the IVC. Other:  No ascites or focal fluid collections. Musculoskeletal: Within the right breast there is an enhancing nodule which measures 2.3 by 1.5 cm, image 06/1000. No enhancing bone lesions identified. IMPRESSION: 1. Intrahepatic and common bile duct dilatation. Beak like narrowing of the distal common bile duct is noted without signs of choledocholithiasis. 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 2. Cholelithiasis. No signs of acute cholecystitis. 3. Striated nephrographic appearance of both kidneys. Findings are nonspecific but can be seen in the setting of pyelonephritis. Correlate with urinalysis. 4. Enhancing nodule within the right breast measures 2.3 x 1.5 cm. Recommend further evaluation with dedicated breast imaging. 5. 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 6.  Azygous continuation of the IVC. Electronically Signed   By: Kimberley Penman M.D.   On: 03/05/2024 05:16   US  Abdomen Limited RUQ (LIVER/GB) Result Date: 03/04/2024 CLINICAL DATA:  Elevated liver function studies. EXAM: ULTRASOUND ABDOMEN LIMITED RIGHT UPPER QUADRANT COMPARISON:  Prior study 07/08/2023 FINDINGS: Gallbladder: Multiple echogenic shadowing gallstones also noted on the prior study. No gallbladder wall thickening, pericholecystic fluid or sonographic Murphy sign to suggest acute cholecystitis. Common bile duct: Diameter: Abnormally dilated at 11.7 mm. There is also mild intrahepatic biliary dilatation. Findings suspicious for obstructing common bile duct stone. Recommend MRI abdomen/MRCP further evaluation. Liver: Normal echogenicity without focal lesion or biliary dilatation. Portal vein is patent on color Doppler imaging with normal direction of blood flow towards the liver. Other: No ascites. IMPRESSION: 1. Cholelithiasis but no sonographic findings for acute cholecystitis. 2. Abnormally dilated common bile duct and mild intrahepatic biliary dilatation. Findings suspicious for obstructing common bile duct stone. Recommend MRI abdomen/MRCP for further evaluation. Electronically Signed   By: Marrian Siva M.D.   On: 03/04/2024 21:40    Microbiology: Recent Results (from the past 240 hours)  Nasopharyngeal Culture     Status: None   Collection Time: 03/05/24  5:27 PM   Specimen: Nasopharyngeal Swab  Result Value Ref Range Status   Specimen Description NASOPHARYNGEAL SWAB  Final   Special Requests Normal  Final   Culture   Final    RARE NORMAL NASOPHARYNGEAL FLORA NO STAPHYLOCOCCUS AUREUS ISOLATED NO GROUP A STREP (S.PYOGENES) ISOLATED Performed at [facility], [address] [place], [place] [address]    Report Status 03/07/2024 FINAL  Final     Labs: Basic Metabolic Panel: Recent Labs  Lab 03/04/24 1440 03/04/24 1448 03/05/24 0136 03/06/24 0337 03/07/24 0242  03/08/24 0811  NA  --  137 142 138 137 138  K  --  3.4* 3.7 3.5 4.0 3.5  CL  --  103 105 102 106 102  CO2  --  24 22 22  17* 24  GLUCOSE  --  124* 88 72 81 95  BUN  --  9 7 <5* 6 6  CREATININE  --  0.91 0.78 0.76 0.85 0.76  CALCIUM  --  9.3 8.6* 9.0 8.8* 9.0  MG 2.2  --  2.1  --   --   --    Liver Function Tests: Recent Labs  Lab 03/04/24 1448 03/05/24 0136 03/06/24 0337 03/07/24 0242 03/08/24 0811  AST 384* 267* 213* 143* 122*  ALT 582* 515* 383* 335* 274*  ALKPHOS 106 108 108 100 79  BILITOT 6.1* 3.2* 5.3* 2.7* 2.2*  PROT 7.2 7.0 6.4* 6.6 6.3*  ALBUMIN 3.7 3.5 3.1* 3.3* 3.2*   Recent Labs  Lab 03/03/24 1407 03/04/24 1448 03/07/24 0242  LIPASE 26 25 25    No results for input(s): "AMMONIA" in the last 168 hours. CBC: Recent Labs  Lab 03/03/24 1407 03/04/24 1448 03/05/24 0136 03/06/24 0337 03/07/24 0242 03/07/24 1452 03/08/24 0811  WBC 6.8   < > 7.7 6.5 9.2 14.9* 11.1*  NEUTROABS 3.7  --  4.8  --   --   --   --   HGB 12.8   < > 13.1 11.4* 11.9* 12.1 11.3*  HCT 38.1   < > 39.0 34.0* 35.6* 36.6 33.9*  MCV 91.1   < > 91.1 92.4 91.5 91.7 91.4  PLT 245   < > 251 234 261 265 235   < > = values in this interval not displayed.  Cardiac Enzymes: No results for input(s): "CKTOTAL", "CKMB", "CKMBINDEX", "TROPONINI" in the last 168 hours. BNP: BNP (last 3 results) No results for input(s): "BNP" in the last 8760 hours.  ProBNP (last 3 results) No results for input(s): "PROBNP" in the last 8760 hours.  CBG: No results for input(s): "GLUCAP" in the last 168 hours.  Signed:  Verlie Glisson MD   [facility] 03/08/2024, 10:58 AM

## 2024-03-08 NOTE — Plan of Care (Signed)
  Problem: Activity: Goal: Risk for activity intolerance will decrease Outcome: Progressing   Problem: Nutrition: Goal: Adequate nutrition will be maintained Outcome: Progressing   Problem: Coping: Goal: Level of anxiety will decrease Outcome: Progressing   Problem: Elimination: Goal: Will not experience complications related to urinary retention Outcome: Progressing   Problem: Pain Managment: Goal: General experience of comfort will improve and/or be controlled Outcome: Progressing

## 2024-03-08 NOTE — Progress Notes (Signed)
 1 Day Post-Op   Subjective/Chief Complaint: Patient without complaints.   Objective: Vital signs in last 24 hours: Temp:  [97.5 F (36.4 C)-98.7 F (37.1 C)] 98.2 F (36.8 C) (04/12 0719) Pulse Rate:  [72-111] 72 (04/12 0719) Resp:  [16-25] 16 (04/12 0719) BP: (104-137)/(63-78) 106/74 (04/12 0719) SpO2:  [96 %-100 %] 100 % (04/12 0719) Weight:  [57.1 kg] 57.1 kg (04/12 0500) Last BM Date : 03/06/24  Intake/Output from previous day: 04/11 0701 - 04/12 0700 In: 1390 [P.O.:490; I.V.:800; IV Piggyback:100] Out: 375 [Urine:375] Intake/Output this shift: No intake/output data recorded.  Abdomen: Port sites clean dry intact.  Soft  Lab Results:  Recent Labs    03/07/24 0242 03/07/24 1452  WBC 9.2 14.9*  HGB 11.9* 12.1  HCT 35.6* 36.6  PLT 261 265   BMET Recent Labs    03/06/24 0337 03/07/24 0242  NA 138 137  K 3.5 4.0  CL 102 106  CO2 22 17*  GLUCOSE 72 81  BUN <5* 6  CREATININE 0.76 0.85  CALCIUM 9.0 8.8*   PT/INR No results for input(s): "LABPROT", "INR" in the last 72 hours. ABG No results for input(s): "PHART", "HCO3" in the last 72 hours.  Invalid input(s): "PCO2", "PO2"  Studies/Results: DG Cholangiogram Operative Result Date: 03/07/2024 CLINICAL DATA:  Laparoscopic cholecystectomy. EXAM: INTRAOPERATIVE CHOLANGIOGRAM TECHNIQUE: Cholangiographic images from the C-arm fluoroscopic device were submitted for interpretation post-operatively. Please see the procedural report for the amount of contrast and the fluoroscopy time utilized. FLUOROSCOPY: Radiation Exposure Index (as provided by the fluoroscopic device): 7.71 mGy Kerma COMPARISON:  ERCP dated 03/06/2024. MR abdomen and MR CP dated 03/05/2024. Limited right upper quadrant abdomen ultrasound dated 03/04/2024 FINDINGS: An initial cine clip of the right upper abdomen demonstrates contrast injection into the cystic duct with progressive opacification of the common duct and intrahepatic ducts.  Mild-to-moderate extrahepatic and intrahepatic ductal dilatation. No intraductal filling defects are seen. Multiple rounded filling defects are noted in the gallbladder. A 2nd cine clip demonstrates a small oval filling defect in the distal common duct and the rounded filling defects in the gallbladder. Two additional C-arm images demonstrate the same small oval filling defect in the distal common duct and rounded filling defects in the gallbladder with unchanged biliary ductal dilatation. IMPRESSION: 1. Persistent small oval filling defect in the distal common duct, suspicious for retained common duct stone with associated biliary ductal dilatation. 2. Cholelithiasis. Electronically Signed   By: Catherin Closs M.D.   On: 03/07/2024 11:41   DG ERCP Result Date: 03/06/2024 CLINICAL DATA:  [id number] Elective surgery [id number] EXAM: ERCP COMPARISON:  MRCP, 03/05/2024. The abdominal sound, 03/04/2024 and 07/08/2023. FLUOROSCOPY: Exposure Index (as provided by the fluoroscopic device): 21.3 mGy Kerma FINDINGS: Limited oblique planar images of the RIGHT upper quadrant obtained C-arm. Images demonstrating flexible endoscopy, biliary duct cannulation, sphincterotomy, retrograde cholangiogram and balloon sweep. Mild extrahepatic biliary ductal dilation. No evidence of biliary filling defect is demonstrated. IMPRESSION: Fluoroscopic imaging for ERCP. For complete description of intra procedural findings, please see performing service dictation. Electronically Signed   By: Art Largo M.D.   On: 03/06/2024 16:07    Anti-infectives: Anti-infectives (From admission, onward)    Start     Dose/Rate Route Frequency Ordered Stop   03/07/24 0800  ceFAZolin (ANCEF) IVPB 2g/100 mL premix        2 g 200 mL/hr over 30 Minutes Intravenous  Once 03/07/24 0753 03/07/24 0925   03/06/24 1245  Ampicillin-Sulbactam (UNASYN) 3 g in sodium  chloride 0.9 % 100 mL IVPB  Status:  Discontinued        3 g 200 mL/hr over 30 Minutes Intravenous   Once 03/06/24 1234 03/06/24 1634   03/04/24 2200  cefTRIAXone (ROCEPHIN) 2 g in sodium chloride 0.9 % 100 mL IVPB  Status:  Discontinued        2 g 200 mL/hr over 30 Minutes Intravenous Every 24 hours 03/04/24 2150 03/06/24 1017       Assessment/Plan: s/p Procedure(s) with comments: LAPAROSCOPIC CHOLECYSTECTOMY (N/A) - WITH ICG DYE CHOLANGIOGRAM, INTRAOPERATIVE Await further GI Rex  Await labs  Stable from a surgical standpoint.  Once GI issues resolved, she can be discharged from a surgery perspective.  LOS: 4 days    Rodrigo Clara MD  03/08/2024

## 2024-03-09 ENCOUNTER — Encounter (HOSPITAL_COMMUNITY): Payer: Self-pay | Admitting: Internal Medicine

## 2024-03-09 LAB — CYTOLOGY - NON PAP

## 2024-03-10 ENCOUNTER — Telehealth: Payer: Self-pay

## 2024-03-10 DIAGNOSIS — R1011 Right upper quadrant pain: Secondary | ICD-10-CM

## 2024-03-10 DIAGNOSIS — K810 Acute cholecystitis: Secondary | ICD-10-CM

## 2024-03-10 DIAGNOSIS — K831 Obstruction of bile duct: Secondary | ICD-10-CM

## 2024-03-10 DIAGNOSIS — K8051 Calculus of bile duct without cholangitis or cholecystitis with obstruction: Secondary | ICD-10-CM

## 2024-03-10 DIAGNOSIS — R7989 Other specified abnormal findings of blood chemistry: Secondary | ICD-10-CM

## 2024-03-10 DIAGNOSIS — R7401 Elevation of levels of liver transaminase levels: Secondary | ICD-10-CM

## 2024-03-10 LAB — SURGICAL PATHOLOGY

## 2024-03-10 NOTE — Telephone Encounter (Signed)
 Lab has been entered  Left message on machine to call back

## 2024-03-10 NOTE — Telephone Encounter (Signed)
-----   Message from Kerby Pearson III sent at 03/08/2024 10:57 AM EDT ----- Regarding: [facility] labs Please contact patient on 03/10/2024 and arrange a hepatic function panel in a week. Indication is elevated LFTs, status post ERCP and cholecystectomy. H Danis

## 2024-03-11 NOTE — Telephone Encounter (Signed)
 Unable to reach pt by phone. Will send My Chart message. She does view her messages.

## 2024-03-11 NOTE — Telephone Encounter (Signed)
 Dr Willy Harvest see note from pt- she has surgical appt on 5/8. She will come in lab as well.
